# Patient Record
Sex: Male | Born: 1970 | Race: White | Hispanic: No | Marital: Single | Smoking: Former smoker
Health system: Southern US, Community
[De-identification: ages and names within clinical notes are randomized; demographics above are authoritative.]

## PROBLEM LIST (undated history)

## (undated) DIAGNOSIS — M5126 Other intervertebral disc displacement, lumbar region: Secondary | ICD-10-CM

## (undated) DIAGNOSIS — K219 Gastro-esophageal reflux disease without esophagitis: Secondary | ICD-10-CM

## (undated) DIAGNOSIS — F329 Major depressive disorder, single episode, unspecified: Secondary | ICD-10-CM

## (undated) DIAGNOSIS — E559 Vitamin D deficiency, unspecified: Secondary | ICD-10-CM

## (undated) DIAGNOSIS — J45909 Unspecified asthma, uncomplicated: Secondary | ICD-10-CM

## (undated) DIAGNOSIS — N200 Calculus of kidney: Secondary | ICD-10-CM

## (undated) DIAGNOSIS — E669 Obesity, unspecified: Secondary | ICD-10-CM

## (undated) DIAGNOSIS — J449 Chronic obstructive pulmonary disease, unspecified: Secondary | ICD-10-CM

## (undated) DIAGNOSIS — M542 Cervicalgia: Secondary | ICD-10-CM

## (undated) DIAGNOSIS — E785 Hyperlipidemia, unspecified: Secondary | ICD-10-CM

## (undated) DIAGNOSIS — F419 Anxiety disorder, unspecified: Secondary | ICD-10-CM

## (undated) DIAGNOSIS — I253 Aneurysm of heart: Secondary | ICD-10-CM

## (undated) DIAGNOSIS — R0989 Other specified symptoms and signs involving the circulatory and respiratory systems: Secondary | ICD-10-CM

## (undated) DIAGNOSIS — R569 Unspecified convulsions: Secondary | ICD-10-CM

## (undated) DIAGNOSIS — I517 Cardiomegaly: Secondary | ICD-10-CM

## (undated) DIAGNOSIS — D496 Neoplasm of unspecified behavior of brain: Secondary | ICD-10-CM

## (undated) DIAGNOSIS — A4902 Methicillin resistant Staphylococcus aureus infection, unspecified site: Secondary | ICD-10-CM

## (undated) DIAGNOSIS — F32A Depression, unspecified: Secondary | ICD-10-CM

## (undated) DIAGNOSIS — G43909 Migraine, unspecified, not intractable, without status migrainosus: Secondary | ICD-10-CM

## (undated) HISTORY — PX: CARDIAC CATHETERIZATION: SHX172

## (undated) HISTORY — PX: KNEE SURGERY: SHX244

---

## 2001-12-28 ENCOUNTER — Emergency Department (HOSPITAL_COMMUNITY): Admission: EM | Admit: 2001-12-28 | Discharge: 2001-12-28 | Payer: Self-pay | Admitting: *Deleted

## 2001-12-28 ENCOUNTER — Encounter: Payer: Self-pay | Admitting: *Deleted

## 2002-10-15 ENCOUNTER — Encounter: Payer: Self-pay | Admitting: *Deleted

## 2002-10-15 ENCOUNTER — Emergency Department (HOSPITAL_COMMUNITY): Admission: EM | Admit: 2002-10-15 | Discharge: 2002-10-15 | Payer: Self-pay | Admitting: *Deleted

## 2002-11-02 ENCOUNTER — Emergency Department (HOSPITAL_COMMUNITY): Admission: EM | Admit: 2002-11-02 | Discharge: 2002-11-02 | Payer: Self-pay | Admitting: *Deleted

## 2002-11-02 ENCOUNTER — Encounter: Payer: Self-pay | Admitting: *Deleted

## 2004-04-27 ENCOUNTER — Emergency Department (HOSPITAL_COMMUNITY): Admission: EM | Admit: 2004-04-27 | Discharge: 2004-04-28 | Payer: Self-pay | Admitting: Emergency Medicine

## 2005-04-30 ENCOUNTER — Observation Stay (HOSPITAL_COMMUNITY): Admission: EM | Admit: 2005-04-30 | Discharge: 2005-05-01 | Payer: Self-pay | Admitting: Emergency Medicine

## 2006-01-21 ENCOUNTER — Emergency Department (HOSPITAL_COMMUNITY): Admission: EM | Admit: 2006-01-21 | Discharge: 2006-01-21 | Payer: Self-pay | Admitting: Emergency Medicine

## 2006-06-10 ENCOUNTER — Emergency Department (HOSPITAL_COMMUNITY): Admission: EM | Admit: 2006-06-10 | Discharge: 2006-06-10 | Payer: Self-pay | Admitting: Emergency Medicine

## 2006-09-04 ENCOUNTER — Inpatient Hospital Stay (HOSPITAL_COMMUNITY): Admission: EM | Admit: 2006-09-04 | Discharge: 2006-09-05 | Payer: Self-pay | Admitting: Emergency Medicine

## 2006-09-04 ENCOUNTER — Ambulatory Visit: Payer: Self-pay | Admitting: Cardiovascular Disease

## 2006-09-04 ENCOUNTER — Encounter: Payer: Self-pay | Admitting: Emergency Medicine

## 2007-05-12 ENCOUNTER — Emergency Department: Payer: Self-pay | Admitting: Emergency Medicine

## 2007-05-25 ENCOUNTER — Other Ambulatory Visit: Payer: Self-pay

## 2007-05-25 ENCOUNTER — Emergency Department: Payer: Self-pay | Admitting: Internal Medicine

## 2007-05-28 ENCOUNTER — Emergency Department: Payer: Self-pay | Admitting: Emergency Medicine

## 2007-05-28 ENCOUNTER — Ambulatory Visit: Payer: Self-pay | Admitting: Urology

## 2007-06-02 ENCOUNTER — Emergency Department: Payer: Self-pay | Admitting: Emergency Medicine

## 2008-01-08 ENCOUNTER — Emergency Department (HOSPITAL_COMMUNITY): Admission: EM | Admit: 2008-01-08 | Discharge: 2008-01-08 | Payer: Self-pay | Admitting: Emergency Medicine

## 2008-02-07 ENCOUNTER — Emergency Department: Payer: Self-pay | Admitting: Emergency Medicine

## 2008-04-30 ENCOUNTER — Encounter
Admission: RE | Admit: 2008-04-30 | Discharge: 2008-05-01 | Payer: Self-pay | Admitting: Physical Medicine & Rehabilitation

## 2008-05-01 ENCOUNTER — Ambulatory Visit: Payer: Self-pay | Admitting: Physical Medicine & Rehabilitation

## 2010-03-27 ENCOUNTER — Emergency Department (HOSPITAL_COMMUNITY): Admission: EM | Admit: 2010-03-27 | Discharge: 2010-03-27 | Payer: Self-pay | Admitting: Emergency Medicine

## 2010-04-11 ENCOUNTER — Emergency Department: Payer: Self-pay | Admitting: Emergency Medicine

## 2010-07-24 ENCOUNTER — Emergency Department: Payer: Self-pay | Admitting: Emergency Medicine

## 2010-12-03 ENCOUNTER — Emergency Department (HOSPITAL_COMMUNITY)
Admission: EM | Admit: 2010-12-03 | Discharge: 2010-12-04 | Disposition: A | Discharge status: Home / Self Care | Payer: Medicaid Other | Attending: Emergency Medicine | Admitting: Emergency Medicine

## 2010-12-03 DIAGNOSIS — J45901 Unspecified asthma with (acute) exacerbation: Secondary | ICD-10-CM | POA: Insufficient documentation

## 2010-12-03 DIAGNOSIS — R0602 Shortness of breath: Secondary | ICD-10-CM | POA: Insufficient documentation

## 2010-12-04 ENCOUNTER — Emergency Department: Payer: Self-pay | Admitting: Emergency Medicine

## 2010-12-13 ENCOUNTER — Emergency Department: Payer: Self-pay | Admitting: Emergency Medicine

## 2010-12-18 ENCOUNTER — Emergency Department: Payer: Self-pay | Admitting: Emergency Medicine

## 2010-12-21 ENCOUNTER — Ambulatory Visit: Payer: Self-pay | Admitting: Urology

## 2010-12-28 ENCOUNTER — Emergency Department: Payer: Self-pay | Admitting: Unknown Physician Specialty

## 2011-01-03 ENCOUNTER — Emergency Department: Payer: Self-pay | Admitting: Emergency Medicine

## 2011-01-23 ENCOUNTER — Ambulatory Visit: Payer: Self-pay | Admitting: Urology
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
## 2011-03-03 NOTE — Discharge Summary (Signed)
Rodney Velasquez, Rodney Velasquez NO.:  [id number]   MEDICAL RECORD NO.:  [id number]          PATIENT TYPE:  INP   LOCATION:  2899                         [facility]asquez:  [facility]   PHYSICIAN:  Rodney Velasquez, MDDATE OF BIRTH:  1971/03/17   DATE OF ADMISSION:  09/04/2006  DATE OF DISCHARGE:  09/05/2006                                 DISCHARGE SUMMARY   CARDIOLOGIST:  The patient his new to our practice.  He was admitted by Dr.  Charlton Velasquez.   PRIMARY CARE PHYSICIAN:  Dr. Jacques Velasquez, [facility].   REASON FOR ADMISSION:  Chest discomfort concerning for unstable angina  pectoris.   DISCHARGE DIAGNOSES:  1. Chest discomfort, noncardiac.  2. Normal coronaries by catheterization this admission.  3. Good left ventricular function.  4. Left shoulder pain, status post reported left shoulder separation.  5. Obesity.  6. Asthma.  7. History of nephrolithiasis.  8. Status post right knee surgery.  9. Status post neck surgery as a child.  10.Elevated thyroid-stimulating hormone, needs followup with primary care      physician.   PROCEDURE PERFORMED THIS ADMISSION:  Cardiac catheterization by Dr. Arvilla Velasquez on the November 21, 20097, revealing normal coronary arteries,  normal LV function, with increased end-diastolic pressure consistent with  diastolic dysfunction.   HISTORY:  Rodney Velasquez is a 40 year old male patient who presented to [facility] on the day of admission with complaints of chest discomfort  located on his left side that awoke him from sleep.  He also had some  radiation to his left axilla, left arm, and noted hand tingling.  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  It was felt that he  should be transferred to [facility] for further evaluation and  treatment.  Dr. Eden Velasquez accepted the patient in transfer.   Velasquez COURSE:  The  patient was interviewed and examined by Dr. Eden Velasquez who  felt the patient should undergo cardiac catheterization to define his  anatomy given his risk factors for coronary disease.  The patient ruled out  for myocardial infarction by enzymes.  He underwent cardiac catheterization  by Dr. Gala Velasquez on that date of discharge.  It is noted this revealed  normal coronaries.  It was felt that the patient could be discharged home  post catheterization as long as his right femoral artery site remained  stable.   At discharge, the patient was noting some problems with his left shoulder  continuing to hurt.  He had this reported history of left shoulder  separation.  This has never happened before.  I spoke to Dr. Otelia Velasquez of  [facility], over the telephone.  He suggested getting an x-ray.  As long as this was okay, he felt that the patient should be stable enough  to be followed up as an outpatient for his shoulder problems.  So, at the  time of this dictation, the shoulder x-ray is pending.  As long as this  is  okay, he will be discharged to home.  The patient lives in [place].  He  will be asked to follow up with his primary care physician next week for  referral to an orthopedist for followup.  We will order a sling for him to  wear for comfort.  He has also been asked to start taking ibuprofen 600 mg 3  times a day for 5 days.  He will be provided with a prescription for  Percocet p.r.n. for pain.  He can get this refilled with a primary care  physician if needed.  We will see him back in our office in a couple of  weeks in [facility] to follow up on his catheterization site.   LABORATORY AND ANCILLARY DATA:  White count 9600, hemoglobin 14.6,  hematocrit 43, platelet count 187,000. INR 1.  Sodium 139, potassium 4,  glucose 106, BUN 13, creatinine 0.8, total bilirubin 0.6, alkaline  phosphatase 59, AST 19, ALT 23, total protein 5.6, albumin 3.5, calcium 8.7.  Hemoglobin A1c 5.2.  Amylase 74, lipase 52. Cardiac markers negative x3.  Total cholesterol 124, triglycerides 135, HDL 46, LDL 51.  TSH 5.555.  Urinary drug screen positive for opiates and THC.  Urinalysis normal.   Chest x-ray from admission: No acute thoracic findings.   DISCHARGE MEDICATIONS:  1. Albuterol p.r.n.  2. Ibuprofen 600 mg 3 times a day for 5 days.  3. Percocet 5/325 mg 1-2 tablets every 6 hours as needed for pain.   DIET:  Low-fat, low-sodium.   ACTIVITY:  The patient to reduce his activity to the left shoulder for the  next couple of weeks and follow up with his primary care physician as noted  above.  He will need referral to an orthopedist for followup on possible  history of left shoulder separation. Regarding his post catheterization  instructions, he is to increase activity slowly.  He may shower.  He may  walk up steps.  He may begin driving after 24 hours.  No lifting or sexual  activity for 3 days.   WOUND CARE:  He should call our office in [facility] for any groin swelling,  bleeding, bruising or fever.   FOLLOWUP:  The patient will see Rodney Velasquez, [facility] Velasquez, on September 20, 2006, at  2 p.m. in our [facility] office for post catheterization followup.  He  should see Dr. Jacques Velasquez next week for followup on his shoulder as well as  thyroid.  He should call for appointment.  As noted above, it is  recommended that he seek referral to an orthopedist for followup on his  shoulder.  Should he need further pain control, he should get refills with  his primary care physician.   Total physician and PA time: Greater than 30 minutes.      Rodney Velasquez, Rodney Velasquez      Rodney Buckles. Bensimhon, MD  Electronically Signed    SW/MEDQ  D:  09/05/2006  T:  09/05/2006  Job:  [id number]   cc:   Dr. Jacques Velasquez, [facility]

## 2011-03-03 NOTE — H&P (Signed)
Rodney Velasquez, Rodney NO.:  Velasquez   MEDICAL RECORD NO.:  [id number]          PATIENT TYPE:  INP   LOCATION:  1828                         FACILITY:  [facility]   PHYSICIAN:  Noralyn Pick. Eden Emms, MD, FACCDATE OF BIRTH:  01-Mar-1971   DATE OF ADMISSION:  09/04/2006  DATE OF DISCHARGE:                                HISTORY & PHYSICAL   PRIMARY CARE PHYSICIAN:  Dr. Stormy Fabian at [facility] in [place].   BRIEF HISTORY:  Mr. Elting is a 40 year old white male who was transferred via  [facility] from [facility] emergency room to [facility] emergency room for  cardiac evaluation.   Mr. Tornow states that he was awakened from sleep at approximately midnight  with a left-sided chest pulling sensation radiating to his left axilla.  He  also noted left arm and hand numbing and tingling. He stated that he was  dreaming that he had been shot in the chest.  He was short of breath when he  awoke and he used his albuterol inhaler which improved his shortness of  breath.  He was also diaphoretic.  His discomfort was a 7 on a scale of 0-  10.  He states that the numbing and tingling in his arm slightly improved  when he raises his hand above his shoulder.  Otherwise the discomfort does  not change with movement.  He denies prior occurrences, recent accidents or  injuries. At [facility] he received IV heparin, nitroglycerin,  morphine, Zofran and aspirin reducing his discomfort to a 3.  Note in the  emergency room his discomfort is a 5, last time it was a 0 was prior to  onset.   PAST MEDICAL HISTORY:  No known drug allergies.   MEDICATIONS:  Include albuterol inhaler p.r.n., ibuprofen p.r.n., aspirin  p.r.n.   MEDICAL HISTORY:  Is notable for obesity, asthma, kidney stones, right knee  surgery, neck surgery as a child. He denies any history of diabetes,  hypertension, myocardial infarction, CVA, COPD, bleeding, thyroid  dysfunction.  He does not know his  cholesterol.   SOCIAL HISTORY:  He resides in [place] with his mother and 19 year old son. 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  He denies any tobacco or alcohol.  He smokes pot 1-3 times  per day. The last time he used was yesterday.  He states that he does not  use any cocaine or IV drugs.  He denies exercise or diet.   FAMILY HISTORY:  His mother is alive and well at age of 6.  Father is  deceased at the age of 35 with myocardial infarction, weak heart, peptic  ulcer disease and history of CVA.  He has nine brothers and sisters.  He  does not know the specifics of their health history but he states he had a  brother that had bypass surgery in his 59s and he has a sister who has had a  stroke.   REVIEW OF SYSTEMS:  In addition to the above is  notable for chronic dyspnea  on exertion.  However, he states this is rare and it particularly occurs  during season changes.  He has urinary urgency and nocturia that he has  attributed to his kidney stones. Bilateral knee arthralgias, dysphagia on  occasion with food and liquid. Additional systems are negative.   PHYSICAL EXAMINATION:  GENERAL:  Well-nourished, well-developed, pleasant  obese white male in no apparent distress.  VITALS:  Temperature is 97, blood pressure is 128/71, pulse 72, respirations  20 to 98% sat on 2 liters.  HEENT: Is unremarkable.  NECK:  Supple without thyromegaly, adenopathy, JVD or carotid bruits.  CHEST:  Symmetrical excursion. Breath, he does have a very soft 1/6 systolic  murmur best appreciated left sternal border.  All pulses are symmetrical,  intact without abdominal or femoral bruits.  CHEST:  Symmetrical excursion.  Clear to auscultation without rales, rhonchi  or wheezing.  SKIN:  Integument is intact without rashes or lesions.  ABDOMEN:  Obese.  Bowel sounds present without organomegaly, masses or  tenderness.  EXTREMITIES:  Negative cyanosis,  clubbing or edema.  MUSCULOSKELETAL:  Unremarkable.  NEURO:  Unremarkable.   Chest x-ray done at [facility] showed no active disease.  There are 2 EKGs  from [facility]. The first being done at 00:45 a.m. I do not believe that  this is the patient is that the EKG appears grossly different from his  subsequent EKGs.  Subsequent EKG showed normal sinus rhythm, normal axis,  normal intervals with a rate of 72, no acute changes.  No old EKGs are  available for comparison.  EKG at [facility] does not show any significant  changes.  I STAT at [facility] showed H&H of 14.3 and 42.0.  Sodium 140,  potassium 3.7, BUN 13, creatinine was not performed. Glucose 151(the patient  states he has not eaten since 10:30 yesterday).  PTT is 70.  PT 13.1, INR  1.0.  Point of care markers were negative x2.   IMPRESSION:  Prolonged atypical chest discomfort of uncertain etiology,  obesity, marijuana use, hyperglycemia.   DISPOSITION:  Dr. Eden Emms reviewed the patient's history, spoke with and  examined the patient and agrees with the above.  We will admit for  observation to rule out myocardial infarction.  We will check our usual labs  as well as lipids and hemoglobin A1c, TSH, urinalysis and urine drug screen.  Given his occupation and his prolonged chest discomfort and significant  family history and his personal risk factors, we will perform a cardiac  catheterization in the morning.      Joellyn Rued, PA-C      Noralyn Pick. Eden Emms, MD, [facility]  Electronically Signed    EW/MEDQ  D:  09/04/2006  T:  09/04/2006  Job:  [id number]   cc:   Dr. Stormy Fabian

## 2011-03-03 NOTE — Cardiovascular Report (Signed)
NAMECASHTON, HOSLEY NO.:  [id number]   MEDICAL RECORD NO.:  [id number]          PATIENT TYPE:  INP   LOCATION:  2899                         FACILITY:  [facility]   PHYSICIAN:  Bevelyn Buckles. Bensimhon, MDDATE OF BIRTH:  October 29, 1970   DATE OF PROCEDURE:  09/05/2006  DATE OF DISCHARGE:                              CARDIAC CATHETERIZATION   PRIMARY CARE Heberto Sturdevant:  [facility].   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  He was admitted with a severe episode of  nocturnal chest pain.  His cardiac markers were negative.  His EKG was  nondiagnostic.  He was brought to the cardiac catheterization lab for  diagnostic angiography.   PROCEDURES PERFORMED:  1. Selective coronary angiography.  2. Left heart cath.  3. Left ventriculogram.  4. AngioSeal femoral closure device.   DESCRIPTION OF PROCEDURE:  The risks and benefits were explained.  Consent  was signed and placed on the chart.  A 6-French arterial sheath was placed  in the right femoral artery using a modified Seldinger technique.  Standard  catheters including a JL-4, a JR-4 and angled pigtail were used for the  procedure.  We also used a 4-French JR-4 to better image the nondominant  right coronary.  There were no apparent complications at the end of the  procedure.  The patient's right groin arteriotomy site was closed with the  Angio-Seal closure device.  There was good hemostasis.   Central aortic pressure was 147/85 with a mean of 113.  LV pressure was  157/12 with an EDP of 23.  There is no aortic stenosis.   Left main was normal.   LAD was a long vessel coursing to the apex.  It gave off 2 small to moderate  diagonals.  There was no angiographic CAD.  The distal LAD was small.   Left circumflex was a large dominant vessel.  It gave off 2 OMs, a small  posterolateral and a small PDA.  There was no angiographic CAD.   The  right coronary was a small nondominant vessel without any significant  coronary disease.   LEFT VENTRICULOGRAM:  EF of 65%.  No wall motion abnormalities.   ASSESSMENT:  1. Normal coronary arteries.  2. Non normal left ventricular function with increased EDP consistent with      diastolic dysfunction.  3. Likely noncardiac chest pain.   PLAN/DISCUSSION:  We will discharge him home today with the plans for  aggressive risk factor modification.  I have made it very clear to him that  despite his Angio-Seal, he is not to drive for at least 24 hours due to the  significant risk of bleeding.      Bevelyn Buckles. Bensimhon, MD  Electronically Signed     DRB/MEDQ  D:  09/05/2006  T:  09/05/2006  Job:  [id number]

## 2011-03-03 NOTE — H&P (Signed)
Rodney Velasquez, MENKEN NO.:  [id number]   MEDICAL RECORD NO.:  [id number]          PATIENT TYPE:  OBV   LOCATION:  A227                          FACILITY:  [facility]   PHYSICIAN:  Jackie Plum, M.D.DATE OF BIRTH:  05-Jun-1971   DATE OF ADMISSION:  04/30/2005  DATE OF DISCHARGE:  LH                                HISTORY & PHYSICAL   CHIEF COMPLAINT:  Chest pain x2 days.   HISTORY OF PRESENT ILLNESS:  The patient presents with 2 days of right-  sided, sharp pain which radiates into this right arm without any known  etiology or contributing factor.  Pain was associated with shortness of  breath and diaphoresis without nausea or vomiting.  Onset of pain was at  rest and it was about 7-8/10 initially by the time of ED evaluation.  He was  given nitroglycerin with some relief of his pain.  There is no history of  cough with sputum production, fever or chills and no abdominal pain.  He  denies dysuria with no history of previous ankle swelling, calf or leg pain.  The patient had a CT scan of the chest done which ruled out pulmonary  embolism.  The patient's lab work with complete metabolic panel was within  normal limits.  His CBC was essentially within normal limits except for  marginally elevated RBC of 5.91.  An ABG done prior to CT scan indicated pH  of 7.42, pCO2 41, pO277, bicarb 26 with O2 saturations 96% on room air.  The  CT scan also noted a small, tiny, 2-3 mm on right lung which was not  consistent with neoplasm, but possibly due to previous infection, per  report.  The patient's pain came down some, but will admit the patient for  observation for cardiac workup.   PAST MEDICAL HISTORY:  1.  No previous history diabetes, hypertension or previous heart disease.  2.  History of asthma.  3.  History of heart murmur.   MEDICATIONS:  Albuterol inhalers.   ALLERGIES:  No known drug allergies.   FAMILY HISTORY:  His father died at age 62.  Prior to his death,  father  suffered a stroke and also a heart attack.  Age at the time of this was  unclear.   SOCIAL HISTORY:  The patient smokes pot occasionally and does not drink  alcohol or smoke cigarettes.   PHYSICAL EXAMINATION:  VITAL SIGNS:  BP 120/76, pulse 69, respirations 16,  temperature 96.9 degrees Fahrenheit.  GENERAL:  The patient is an obese gentleman in acute pulmonary distress.  HEENT:  No pallor, no icterus.  Oropharynx moist.  NECK:  Supple.  No carotid bruits.  LUNGS:  Clear to auscultation.  CARDIAC:  Regular rate and rhythm.  No gallops or murmurs.  ABDOMEN:  Obese, soft, bowel sounds present.  EXTREMITIES:  No cyanosis or edema.  NEUROLOGIC:  Nonfocal.   LABORATORY DATA AND X-RAY FINDINGS:  Please see above for complete metabolic  panel, CBC, saturations on room air and CT scan of the chest as noted above.   EKG was  reported to have shown sinus rhythm at 88 beats per minute without  any acute ST changes.   IMPRESSION:  Chest pain in an obese, 40 year old gentleman without any  previous heart disease or any diabetes, hypertension or dyslipidemia.   PLAN:  The patient will be admitted to [facility] to monitor enzymes.  Stress test  could be considered in the morning.       GO/MEDQ  D:  05/01/2005  T:  05/01/2005  Job:  [id number]

## 2011-07-10 LAB — BASIC METABOLIC PANEL
BUN: 13
CO2: 27
Chloride: 102
GFR calc Af Amer: >60
Glucose, Bld: 154 — ABNORMAL HIGH
Potassium: 3.8
Sodium: 134 — ABNORMAL LOW

## 2011-07-10 LAB — CBC
HCT: 45.8
Hemoglobin: 15.9
MCHC: 34.8
MCV: 80
WBC: 7.4

## 2011-07-10 LAB — DIFFERENTIAL
Lymphs Abs: 1.4
Neutrophils Relative %: 71

## 2011-09-05 ENCOUNTER — Emergency Department: Payer: Self-pay | Admitting: Emergency Medicine

## 2011-10-04 ENCOUNTER — Emergency Department: Payer: Self-pay | Admitting: Emergency Medicine

## 2011-12-19 ENCOUNTER — Emergency Department: Payer: Self-pay | Admitting: Emergency Medicine

## 2012-02-26 ENCOUNTER — Emergency Department: Payer: Self-pay | Admitting: Emergency Medicine

## 2012-10-14 ENCOUNTER — Emergency Department: Payer: Self-pay | Admitting: Emergency Medicine

## 2012-11-02 ENCOUNTER — Emergency Department: Payer: Self-pay | Admitting: Emergency Medicine

## 2012-11-28 ENCOUNTER — Emergency Department: Payer: Self-pay | Admitting: Emergency Medicine

## 2012-12-03 ENCOUNTER — Emergency Department: Payer: Self-pay | Admitting: Emergency Medicine

## 2012-12-08 ENCOUNTER — Emergency Department: Payer: Self-pay | Admitting: Emergency Medicine

## 2012-12-13 ENCOUNTER — Emergency Department: Payer: Self-pay | Admitting: Emergency Medicine

## 2012-12-14 DIAGNOSIS — N200 Calculus of kidney: Secondary | ICD-10-CM

## 2012-12-14 HISTORY — DX: Calculus of kidney: N20.0

## 2012-12-24 ENCOUNTER — Encounter (HOSPITAL_COMMUNITY): Payer: Self-pay | Admitting: *Deleted

## 2012-12-24 ENCOUNTER — Emergency Department (HOSPITAL_COMMUNITY): Payer: Medicaid Other

## 2012-12-24 ENCOUNTER — Emergency Department (HOSPITAL_COMMUNITY)
Admission: EM | Admit: 2012-12-24 | Discharge: 2012-12-25 | Disposition: A | Discharge status: Home / Self Care | Payer: Medicaid Other | Attending: Emergency Medicine | Admitting: Emergency Medicine

## 2012-12-24 DIAGNOSIS — J449 Chronic obstructive pulmonary disease, unspecified: Secondary | ICD-10-CM | POA: Insufficient documentation

## 2012-12-24 DIAGNOSIS — Z79899 Other long term (current) drug therapy: Secondary | ICD-10-CM | POA: Insufficient documentation

## 2012-12-24 DIAGNOSIS — R110 Nausea: Secondary | ICD-10-CM | POA: Insufficient documentation

## 2012-12-24 DIAGNOSIS — R319 Hematuria, unspecified: Secondary | ICD-10-CM | POA: Insufficient documentation

## 2012-12-24 DIAGNOSIS — R350 Frequency of micturition: Secondary | ICD-10-CM | POA: Insufficient documentation

## 2012-12-24 DIAGNOSIS — J4489 Other specified chronic obstructive pulmonary disease: Secondary | ICD-10-CM | POA: Insufficient documentation

## 2012-12-24 DIAGNOSIS — Z9889 Other specified postprocedural states: Secondary | ICD-10-CM | POA: Insufficient documentation

## 2012-12-24 DIAGNOSIS — N201 Calculus of ureter: Secondary | ICD-10-CM | POA: Insufficient documentation

## 2012-12-24 DIAGNOSIS — F17200 Nicotine dependence, unspecified, uncomplicated: Secondary | ICD-10-CM | POA: Insufficient documentation

## 2012-12-24 DIAGNOSIS — N390 Urinary tract infection, site not specified: Secondary | ICD-10-CM | POA: Insufficient documentation

## 2012-12-24 DIAGNOSIS — Z8739 Personal history of other diseases of the musculoskeletal system and connective tissue: Secondary | ICD-10-CM | POA: Insufficient documentation

## 2012-12-24 DIAGNOSIS — R300 Dysuria: Secondary | ICD-10-CM | POA: Insufficient documentation

## 2012-12-24 HISTORY — DX: Other intervertebral disc displacement, lumbar region: M51.26

## 2012-12-24 HISTORY — DX: Chronic obstructive pulmonary disease, unspecified: J44.9

## 2012-12-24 HISTORY — DX: Calculus of kidney: N20.0

## 2012-12-24 LAB — URINALYSIS, ROUTINE W REFLEX MICROSCOPIC
Glucose, UA: NEGATIVE mg/dL
Ketones, ur: NEGATIVE mg/dL
Nitrite: NEGATIVE
Specific Gravity, Urine: 1.025 (ref 1.005–1.030)
pH: 6.5 (ref 5.0–8.0)

## 2012-12-24 LAB — BASIC METABOLIC PANEL
CO2: 25 mEq/L (ref 19–32)
Calcium: 9.8 mg/dL (ref 8.4–10.5)
Creatinine, Ser: 0.92 mg/dL (ref 0.50–1.35)
GFR calc Af Amer: >90 mL/min (ref >90)
GFR calc non Af Amer: >90 mL/min (ref >90)
Glucose, Bld: 87 mg/dL (ref 70–99)
Potassium: 3.9 mEq/L (ref 3.5–5.1)
Sodium: 138 mEq/L (ref 135–145)

## 2012-12-24 LAB — URINE MICROSCOPIC-ADD ON

## 2012-12-24 MED ORDER — HYDROMORPHONE HCL PF 1 MG/ML IJ SOLN
1.0000 mg | Freq: Once | INTRAMUSCULAR | Status: AC
  Administered 2012-12-24: 1 mg via INTRAVENOUS
  Filled 2012-12-24: qty 1

## 2012-12-24 MED ORDER — TAMSULOSIN HCL 0.4 MG PO CAPS: 0.4000 mg | ORAL_CAPSULE | Freq: Every day | ORAL | Status: DC

## 2012-12-24 MED ORDER — TAMSULOSIN HCL 0.4 MG PO CAPS
0.4000 mg | ORAL_CAPSULE | Freq: Once | ORAL | Status: AC
  Administered 2012-12-25: 0.4 mg via ORAL
  Filled 2012-12-24: qty 1

## 2012-12-24 MED ORDER — ONDANSETRON HCL 4 MG/2ML IJ SOLN
4.0000 mg | Freq: Once | INTRAMUSCULAR | Status: AC
  Administered 2012-12-24: 4 mg via INTRAVENOUS
  Filled 2012-12-24: qty 2

## 2012-12-24 MED ORDER — CEPHALEXIN 500 MG PO CAPS: 500.0000 mg | ORAL_CAPSULE | Freq: Four times a day (QID) | ORAL | Status: DC

## 2012-12-24 MED ORDER — DEXTROSE 5 % IV SOLN
1.0000 g | Freq: Once | INTRAVENOUS | Status: AC
  Administered 2012-12-24: 1 g via INTRAVENOUS
  Filled 2012-12-24: qty 10

## 2012-12-24 MED ORDER — OXYCODONE-ACETAMINOPHEN 5-325 MG PO TABS: 1.0000 | ORAL_TABLET | ORAL | Status: DC | PRN

## 2012-12-24 MED ORDER — KETOROLAC TROMETHAMINE 30 MG/ML IJ SOLN
30.0000 mg | Freq: Once | INTRAMUSCULAR | Status: AC
  Administered 2012-12-24: 30 mg via INTRAVENOUS
  Filled 2012-12-24: qty 1

## 2012-12-24 NOTE — ED Notes (Addendum)
Right flank pain that radiates to right groin, started yesterday but pain has increased, + nausea, denies vomiting, + blood in urine per pt., hx of kidney stones

## 2012-12-25 MED ORDER — TAMSULOSIN HCL 0.4 MG PO CAPS
ORAL_CAPSULE | ORAL | Status: AC
  Filled 2012-12-25: qty 1

## 2012-12-26 LAB — URINE CULTURE: Culture: NO GROWTH

## 2012-12-28 NOTE — ED Provider Notes (Signed)
History     CSN: [id number]  Arrival date & time 12/24/12  2045   First MD Initiated Contact with Patient 12/24/12 2124      Chief Complaint  Patient presents with  . Flank Pain    (Consider location/radiation/quality/duration/timing/severity/associated sxs/prior treatment) HPI Comments: Rodney Velasquez is a 42 y.o. Male presenting with right flank pain now radiating into his right groin since yesterday.  Pain is sharp and intermittent.  He reports nausea without emesis and has had hematuria and increased urinary frequency of small amounts of urine.  He has a history of kidney stones and has never had a need for surgical intervention.  He does not have a urologist.  He denies fevers or chills, abdominal pain, shortness of breath.  He has taken tylenol without improved pain.  Nothing makes his symptoms worse.       The history is provided by the patient.    Past Medical History  Diagnosis Date  . Kidney stones   . COPD (chronic obstructive pulmonary disease)   . Ruptured lumbar disc     Past Surgical History  Procedure Laterality Date  . Knee surgery    . Cardiac catheterization      History reviewed. No pertinent family history.  History  Substance Use Topics  . Smoking status: Light Tobacco Smoker    Types: Cigarettes  . Smokeless tobacco: Not on file  . Alcohol Use: No      Review of Systems  Constitutional: Negative for fever.  HENT: Negative for congestion, sore throat and neck pain.   Eyes: Negative.   Respiratory: Negative for chest tightness and shortness of breath.   Cardiovascular: Negative for chest pain.  Gastrointestinal: Positive for nausea. Negative for vomiting, abdominal pain and blood in stool.  Genitourinary: Positive for dysuria, hematuria and flank pain.  Musculoskeletal: Negative for joint swelling and arthralgias.  Skin: Negative.  Negative for rash and wound.  Neurological: Negative for dizziness, weakness, light-headedness, numbness and  headaches.  Psychiatric/Behavioral: Negative.     Allergies  Review of patient's allergies indicates no known allergies.  Home Medications   Current Outpatient Rx  Name  Route  Sig  Dispense  Refill  . acetaminophen (TYLENOL) 500 MG tablet   Oral   Take 1,500-2,000 mg by mouth 4 (four) times daily as needed for pain.         Marland Kitchen albuterol (PROVENTIL HFA;VENTOLIN HFA) 108 (90 BASE) MCG/ACT inhaler   Inhalation   Inhale 2 puffs into the lungs every 6 (six) hours as needed for wheezing or shortness of breath.         . clonazePAM (KLONOPIN) 0.5 MG tablet   Oral   Take 0.5 mg by mouth 3 (three) times daily as needed for anxiety.         . cephALEXin (KEFLEX) 500 MG capsule   Oral   Take 1 capsule (500 mg total) by mouth 4 (four) times daily.   40 capsule   0   . oxyCODONE-acetaminophen (PERCOCET/ROXICET) 5-325 MG per tablet   Oral   Take 1 tablet by mouth every 4 (four) hours as needed for pain.   20 tablet   0   . tamsulosin (FLOMAX) 0.4 MG CAPS   Oral   Take 1 capsule (0.4 mg total) by mouth daily after supper.   10 capsule   0     BP 156/114  Pulse 96  Temp(Src) 98.2 F (36.8 C) (Oral)  Resp 20  Ht 5'  6.5" (1.689 m)  Wt 240 lb (108.863 kg)  BMI 38.16 kg/m2  SpO2 100%  Physical Exam  Nursing note and vitals reviewed. Constitutional: He appears well-developed and well-nourished.  HENT:  Head: Normocephalic and atraumatic.  Eyes: Conjunctivae are normal.  Neck: Normal range of motion.  Cardiovascular: Normal rate, regular rhythm, normal heart sounds and intact distal pulses.   Pulmonary/Chest: Effort normal and breath sounds normal. He has no wheezes.  Abdominal: Soft. Bowel sounds are normal. There is no tenderness. There is CVA tenderness.  Musculoskeletal: Normal range of motion.  Neurological: He is alert.  Skin: Skin is warm and dry.  Psychiatric: He has a normal mood and affect.    ED Course  Procedures (including critical care time)  Labs  Reviewed  URINALYSIS, ROUTINE W REFLEX MICROSCOPIC - Abnormal; Notable for the following:    APPearance HAZY (*)    Hgb urine dipstick LARGE (*)    Protein, ur TRACE (*)    Urobilinogen, UA 4.0 (*)    Leukocytes, UA SMALL (*)    All other components within normal limits  URINE MICROSCOPIC-ADD ON - Abnormal; Notable for the following:    Squamous Epithelial / LPF FEW (*)    Bacteria, UA MANY (*)    Casts WBC CAST (*)    All other components within normal limits  URINE CULTURE  BASIC METABOLIC PANEL   No results found.   1. Right ureteral stone   2. UTI (lower urinary tract infection)    Pt given IV fluids,  zofran and dilaudid IV with improved pain.  Rocephin 1 gram IV.  toradol 30 IV prior to dc home.  First dose of flomax also given.   MDM  Spoke with on call PA with [facility],  Ms. Ostler (sp?).  Plan for f/u with their office this week. Pt to call for appt..  Urine cx sent. Patient was given rocephin 1 gram IV prior to dc home.  Prescribed keflex,  Oxycodone,  Also recommended flomax by Urology.  First dose given prior to dc home.  Patients labs and/or radiological studies were viewed and considered during the medical decision making and disposition process.         Burgess Amor, PA-C 12/28/12 [phone]

## 2012-12-29 NOTE — ED Provider Notes (Signed)
Medical screening examination/treatment/procedure(s) were performed by non-physician practitioner and as supervising physician I was immediately available for consultation/collaboration.   Dione Booze, MD 12/29/12 [phone]

## 2013-01-01 ENCOUNTER — Encounter (HOSPITAL_COMMUNITY): Payer: Self-pay

## 2013-01-01 ENCOUNTER — Emergency Department (HOSPITAL_COMMUNITY)
Admission: EM | Admit: 2013-01-01 | Discharge: 2013-01-01 | Disposition: A | Discharge status: Home / Self Care | Payer: Medicaid Other | Attending: Emergency Medicine | Admitting: Emergency Medicine

## 2013-01-01 DIAGNOSIS — J4489 Other specified chronic obstructive pulmonary disease: Secondary | ICD-10-CM | POA: Insufficient documentation

## 2013-01-01 DIAGNOSIS — Z8739 Personal history of other diseases of the musculoskeletal system and connective tissue: Secondary | ICD-10-CM | POA: Insufficient documentation

## 2013-01-01 DIAGNOSIS — N200 Calculus of kidney: Secondary | ICD-10-CM | POA: Insufficient documentation

## 2013-01-01 DIAGNOSIS — F17200 Nicotine dependence, unspecified, uncomplicated: Secondary | ICD-10-CM | POA: Insufficient documentation

## 2013-01-01 DIAGNOSIS — Z79899 Other long term (current) drug therapy: Secondary | ICD-10-CM | POA: Insufficient documentation

## 2013-01-01 DIAGNOSIS — R112 Nausea with vomiting, unspecified: Secondary | ICD-10-CM | POA: Insufficient documentation

## 2013-01-01 DIAGNOSIS — J449 Chronic obstructive pulmonary disease, unspecified: Secondary | ICD-10-CM | POA: Insufficient documentation

## 2013-01-01 HISTORY — DX: Calculus of kidney: N20.0

## 2013-01-01 LAB — URINALYSIS, ROUTINE W REFLEX MICROSCOPIC
Ketones, ur: NEGATIVE mg/dL
Nitrite: NEGATIVE
Specific Gravity, Urine: <1.005 — ABNORMAL LOW (ref 1.005–1.030)
Urobilinogen, UA: 0.2 mg/dL (ref 0.0–1.0)

## 2013-01-01 MED ORDER — OXYCODONE-ACETAMINOPHEN 5-325 MG PO TABS: 1.0000 | ORAL_TABLET | ORAL | Status: DC | PRN

## 2013-01-01 MED ORDER — ONDANSETRON HCL 4 MG/2ML IJ SOLN
4.0000 mg | Freq: Once | INTRAMUSCULAR | Status: AC
  Administered 2013-01-01: 4 mg via INTRAVENOUS
  Filled 2013-01-01: qty 2

## 2013-01-01 MED ORDER — CEPHALEXIN 500 MG PO CAPS: 500.0000 mg | ORAL_CAPSULE | Freq: Four times a day (QID) | ORAL | Status: DC

## 2013-01-01 MED ORDER — HYDROMORPHONE HCL PF 2 MG/ML IJ SOLN
2.0000 mg | Freq: Once | INTRAMUSCULAR | Status: AC
  Administered 2013-01-01: 2 mg via INTRAVENOUS
  Filled 2013-01-01: qty 1

## 2013-01-01 NOTE — ED Notes (Signed)
Known renal stones, c/o pain right flank with nausea/vomiting
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
## 2013-01-20 ENCOUNTER — Ambulatory Visit: Payer: Self-pay | Admitting: Family Medicine

## 2013-02-19 ENCOUNTER — Emergency Department: Payer: Self-pay | Admitting: Emergency Medicine

## 2013-02-19 LAB — DRUG SCREEN, URINE
Barbiturates, Ur Screen: NEGATIVE (ref ?–200)
Benzodiazepine, Ur Scrn: NEGATIVE (ref ?–200)
Cannabinoid 50 Ng, Ur ~~LOC~~: NEGATIVE (ref ?–50)
MDMA (Ecstasy)Ur Screen: NEGATIVE (ref ?–500)
Methadone, Ur Screen: NEGATIVE (ref ?–300)
Opiate, Ur Screen: NEGATIVE (ref ?–300)
Tricyclic, Ur Screen: NEGATIVE (ref ?–1000)

## 2013-02-19 LAB — COMPREHENSIVE METABOLIC PANEL
Albumin: 4.1 g/dL (ref 3.4–5.0)
Anion Gap: 9 (ref 7–16)
Calcium, Total: 9.5 mg/dL (ref 8.5–10.1)
Co2: 26 mmol/L (ref 21–32)
Creatinine: 0.77 mg/dL (ref 0.60–1.30)
EGFR (African American): >60
EGFR (Non-African Amer.): >60
Potassium: 3.7 mmol/L (ref 3.5–5.1)
SGPT (ALT): 25 U/L (ref 12–78)
Sodium: 140 mmol/L (ref 136–145)
Total Protein: 7.2 g/dL (ref 6.4–8.2)

## 2013-02-19 LAB — URINALYSIS, COMPLETE
Bilirubin,UR: NEGATIVE
Ph: 5 (ref 4.5–8.0)
RBC,UR: 38 /HPF (ref 0–5)
Specific Gravity: 1.029 (ref 1.003–1.030)
Squamous Epithelial: 10
WBC UR: 287 /HPF (ref 0–5)

## 2013-02-19 LAB — CBC
HCT: 47.4 % (ref 40.0–52.0)
HGB: 16 g/dL (ref 13.0–18.0)
Platelet: 184 10*3/uL (ref 150–440)
RDW: 13.7 % (ref 11.5–14.5)

## 2013-02-19 LAB — ETHANOL: Ethanol: <3 mg/dL

## 2013-03-09 ENCOUNTER — Encounter (HOSPITAL_COMMUNITY): Payer: Self-pay | Admitting: *Deleted

## 2013-03-09 ENCOUNTER — Emergency Department (HOSPITAL_COMMUNITY)
Admission: EM | Admit: 2013-03-09 | Discharge: 2013-03-09 | Disposition: A | Discharge status: Home / Self Care | Payer: Medicaid Other | Attending: Emergency Medicine | Admitting: Emergency Medicine

## 2013-03-09 DIAGNOSIS — G43909 Migraine, unspecified, not intractable, without status migrainosus: Secondary | ICD-10-CM | POA: Insufficient documentation

## 2013-03-09 DIAGNOSIS — G8929 Other chronic pain: Secondary | ICD-10-CM | POA: Insufficient documentation

## 2013-03-09 DIAGNOSIS — J4489 Other specified chronic obstructive pulmonary disease: Secondary | ICD-10-CM | POA: Insufficient documentation

## 2013-03-09 DIAGNOSIS — M545 Low back pain, unspecified: Secondary | ICD-10-CM | POA: Insufficient documentation

## 2013-03-09 DIAGNOSIS — Z87442 Personal history of urinary calculi: Secondary | ICD-10-CM | POA: Insufficient documentation

## 2013-03-09 DIAGNOSIS — Z8739 Personal history of other diseases of the musculoskeletal system and connective tissue: Secondary | ICD-10-CM | POA: Insufficient documentation

## 2013-03-09 DIAGNOSIS — Z86011 Personal history of benign neoplasm of the brain: Secondary | ICD-10-CM | POA: Insufficient documentation

## 2013-03-09 DIAGNOSIS — G40909 Epilepsy, unspecified, not intractable, without status epilepticus: Secondary | ICD-10-CM | POA: Insufficient documentation

## 2013-03-09 DIAGNOSIS — Z8679 Personal history of other diseases of the circulatory system: Secondary | ICD-10-CM | POA: Insufficient documentation

## 2013-03-09 DIAGNOSIS — F17200 Nicotine dependence, unspecified, uncomplicated: Secondary | ICD-10-CM | POA: Insufficient documentation

## 2013-03-09 DIAGNOSIS — J449 Chronic obstructive pulmonary disease, unspecified: Secondary | ICD-10-CM | POA: Insufficient documentation

## 2013-03-09 DIAGNOSIS — Z79899 Other long term (current) drug therapy: Secondary | ICD-10-CM | POA: Insufficient documentation

## 2013-03-09 DIAGNOSIS — N390 Urinary tract infection, site not specified: Secondary | ICD-10-CM | POA: Insufficient documentation

## 2013-03-09 HISTORY — DX: Unspecified asthma, uncomplicated: J45.909

## 2013-03-09 HISTORY — DX: Migraine, unspecified, not intractable, without status migrainosus: G43.909

## 2013-03-09 HISTORY — DX: Unspecified convulsions: R56.9

## 2013-03-09 HISTORY — DX: Cardiomegaly: I51.7

## 2013-03-09 HISTORY — DX: Neoplasm of unspecified behavior of brain: D49.6

## 2013-03-09 HISTORY — DX: Other specified symptoms and signs involving the circulatory and respiratory systems: R09.89

## 2013-03-09 LAB — URINALYSIS, ROUTINE W REFLEX MICROSCOPIC
Ketones, ur: NEGATIVE mg/dL
Nitrite: NEGATIVE
Protein, ur: NEGATIVE mg/dL
Urobilinogen, UA: 1 mg/dL (ref 0.0–1.0)
pH: 6 (ref 5.0–8.0)

## 2013-03-09 MED ORDER — OXYCODONE-ACETAMINOPHEN 5-325 MG PO TABS
1.0000 | ORAL_TABLET | Freq: Once | ORAL | Status: AC
  Administered 2013-03-09: 1 via ORAL
  Filled 2013-03-09: qty 1

## 2013-03-09 MED ORDER — DIVALPROEX SODIUM 250 MG PO DR TAB
500.0000 mg | DELAYED_RELEASE_TABLET | Freq: Once | ORAL | Status: AC
  Administered 2013-03-09: 500 mg via ORAL
  Filled 2013-03-09: qty 2

## 2013-03-09 MED ORDER — LIDOCAINE HCL (PF) 1 % IJ SOLN
INTRAMUSCULAR | Status: AC
  Filled 2013-03-09: qty 5

## 2013-03-09 MED ORDER — CEFTRIAXONE SODIUM 1 G IJ SOLR
1.0000 g | Freq: Once | INTRAMUSCULAR | Status: AC
  Administered 2013-03-09: 1 g via INTRAMUSCULAR
  Filled 2013-03-09: qty 10

## 2013-03-09 MED ORDER — CYCLOBENZAPRINE HCL 10 MG PO TABS
10.0000 mg | ORAL_TABLET | Freq: Once | ORAL | Status: AC
  Administered 2013-03-09: 10 mg via ORAL
  Filled 2013-03-09: qty 1

## 2013-03-09 MED ORDER — CIPROFLOXACIN HCL 250 MG PO TABS
500.0000 mg | ORAL_TABLET | Freq: Two times a day (BID) | ORAL | Status: DC
  Administered 2013-03-09: 500 mg via ORAL
  Filled 2013-03-09: qty 2

## 2013-03-09 NOTE — ED Notes (Signed)
Pt incarcerated Friday and has not had seizure med Depakote or pain meds Percocet, Tylenol #3 or Lyrica for chronic neck and back pain, states depends on severity of pain depends if he takes Percocet of Tylenol #3 per pt.

## 2013-03-09 NOTE — ED Provider Notes (Signed)
History     CSN: [id number]  Arrival date & time 03/09/13  1033   First MD Initiated Contact with Patient 03/09/13 1218      Chief Complaint  Patient presents with  . Back Pain    (Consider location/radiation/quality/duration/timing/severity/associated sxs/prior treatment) HPI Rodney Velasquez is a 42 y.o. male who presents to the ED with back pain. He is accompanied by the [facility].  He has a history of chronic pain and has been incarcerated for 2 days and not had his medication. The nurse at the jail will not be back until Tuesday to administer medications. Also has hx of seizures and has not had his Depakote. He has also been on Cipro for a UTI that he has not had in 2 days. The history was provided by the patient.  Past Medical History  Diagnosis Date  . Kidney stones   . COPD (chronic obstructive pulmonary disease)   . Ruptured lumbar disc   . Renal stone 12/14/12  . Seizures   . Migraine   . Asthma   . Enlarged heart   . Poor circulation   . Brain tumor     Past Surgical History  Procedure Laterality Date  . Knee surgery    . Cardiac catheterization      No family history on file.  History  Substance Use Topics  . Smoking status: Heavy Tobacco Smoker -- 0.50 packs/day    Types: Cigarettes  . Smokeless tobacco: Not on file  . Alcohol Use: No      Review of Systems  Constitutional: Negative for fever and chills.  HENT: Negative for neck pain.   Respiratory: Negative for chest tightness.   Gastrointestinal: Negative for nausea and vomiting.  Musculoskeletal: Positive for back pain.  Skin: Negative for wound.  Neurological: Negative for headaches.  Psychiatric/Behavioral: The patient is not nervous/anxious.     Allergies  Review of patient's allergies indicates no known allergies.  Home Medications   Current Outpatient Rx  Name  Route  Sig  Dispense  Refill  . acetaminophen (TYLENOL) 500 MG tablet   Oral   Take 500-1,000 mg by mouth daily as needed for  pain.          Marland Kitchen albuterol (PROVENTIL HFA;VENTOLIN HFA) 108 (90 BASE) MCG/ACT inhaler   Inhalation   Inhale 2 puffs into the lungs every 6 (six) hours as needed for wheezing or shortness of breath.         . cephALEXin (KEFLEX) 500 MG capsule   Oral   Take 1 capsule (500 mg total) by mouth 4 (four) times daily.   40 capsule   0   . cephALEXin (KEFLEX) 500 MG capsule   Oral   Take 1 capsule (500 mg total) by mouth 4 (four) times daily.   28 capsule   0   . clonazePAM (KLONOPIN) 0.5 MG tablet   Oral   Take 0.5 mg by mouth 3 (three) times daily as needed for anxiety.         Marland Kitchen oxyCODONE-acetaminophen (PERCOCET/ROXICET) 5-325 MG per tablet   Oral   Take 1 tablet by mouth every 4 (four) hours as needed for pain.   20 tablet   0   . oxyCODONE-acetaminophen (PERCOCET/ROXICET) 5-325 MG per tablet   Oral   Take 1 tablet by mouth every 4 (four) hours as needed for pain.   20 tablet   0   . tamsulosin (FLOMAX) 0.4 MG CAPS   Oral   Take  1 capsule (0.4 mg total) by mouth daily after supper.   10 capsule   0     BP 128/88  Pulse 78  Temp(Src) 98.4 F (36.9 C) (Oral)  Resp 18  Ht 5\' 7"  (1.702 m)  Wt 235 lb (106.595 kg)  BMI 36.8 kg/m2  SpO2 98%  Physical Exam  Nursing note and vitals reviewed. Constitutional: He is oriented to person, place, and time. He appears well-developed and well-nourished. No distress.  HENT:  Head: Normocephalic and atraumatic.  Eyes: EOM are normal.  Neck: Neck supple.  Cardiovascular: Normal rate and regular rhythm.   Pulmonary/Chest: Effort normal and breath sounds normal.  Abdominal: Soft. There is no tenderness.  Musculoskeletal:       Lumbar back: He exhibits decreased range of motion and tenderness. He exhibits no spasm.       Back:  Pain across lower lumbar area with palpation. Pedal pulses strong and equal bilateral. Adequate circulation, good touch sensation.  Neurological: He is alert and oriented to person, place, and  time. He has normal strength and normal reflexes. No cranial nerve deficit or sensory deficit.  Skin: Skin is warm and dry.  Psychiatric: He has a normal mood and affect. His behavior is normal. Judgment and thought content normal.    ED Course  Procedures (including critical care time) Results for orders placed during the hospital encounter of 03/09/13 (from the past 24 hour(s))  URINALYSIS, ROUTINE W REFLEX MICROSCOPIC     Status: Abnormal   Collection Time    03/09/13 12:28 PM      Result Value Range   Color, Urine YELLOW  YELLOW   APPearance CLEAR  CLEAR   Specific Gravity, Urine 1.025  1.005 - 1.030   pH 6.0  5.0 - 8.0   Glucose, UA NEGATIVE  NEGATIVE mg/dL   Hgb urine dipstick MODERATE (*) NEGATIVE   Bilirubin Urine NEGATIVE  NEGATIVE   Ketones, ur NEGATIVE  NEGATIVE mg/dL   Protein, ur NEGATIVE  NEGATIVE mg/dL   Urobilinogen, UA 1.0  0.0 - 1.0 mg/dL   Nitrite NEGATIVE  NEGATIVE   Leukocytes, UA MODERATE (*) NEGATIVE  URINE MICROSCOPIC-ADD ON     Status: Abnormal   Collection Time    03/09/13 12:28 PM      Result Value Range   Squamous Epithelial / LPF FEW (*) RARE   WBC, UA 21-50  <3 WBC/hpf   RBC / HPF 7-10  <3 RBC/hpf   Bacteria, UA FEW (*) RARE    MDM  42 y.o. male with chronic low back pain and UTI. He is [facility] from the jail for medication for his UTI, chronic back pain and his seizure medication because there will be no nurse at the jail until Tuesday. Will give him his dose of Depakote, Cipro and Percocet for today.  Will also give Rocephin 1 Gram IM since he can not get his medication until the RN is back. He is not allowed to take any medication with him back to the jail. They will return with the patient if needed before the RN returns.       [place], [place] 03/09/13 [phone]

## 2013-03-09 NOTE — ED Notes (Signed)
Pt has been incarcerated x 2 days.  Hx of chronic neck/back pain.  States has not had meds since.  Nurse at jail will not be in until Tuesday to administer meds.  C/o chronic pain.  Pt also has hx of seizures and has not had depakote since Friday also.  Denies seizure activity at this time.

## 2013-03-10 NOTE — ED Provider Notes (Signed)
Medical screening examination/treatment/procedure(s) were performed by non-physician practitioner and as supervising physician I was immediately available for consultation/collaboration.   Dione Booze, MD 03/10/13 [phone]

## 2013-03-11 LAB — URINE CULTURE
Colony Count: NO GROWTH
Culture: NO GROWTH

## 2013-03-22 ENCOUNTER — Emergency Department: Payer: Self-pay | Admitting: Unknown Physician Specialty

## 2013-03-22 LAB — URINALYSIS, COMPLETE
Bacteria: NONE SEEN
Bilirubin,UR: NEGATIVE
Glucose,UR: NEGATIVE mg/dL (ref 0–75)
Ketone: NEGATIVE
Ph: 7 (ref 4.5–8.0)

## 2013-03-24 LAB — URINE CULTURE

## 2013-05-17 ENCOUNTER — Emergency Department: Payer: Self-pay | Admitting: Emergency Medicine

## 2013-05-17 LAB — URINALYSIS, COMPLETE
Bilirubin,UR: NEGATIVE
Glucose,UR: NEGATIVE mg/dL (ref 0–75)
Ketone: NEGATIVE
Ph: 6 (ref 4.5–8.0)
Protein: NEGATIVE
RBC,UR: 3 /HPF (ref 0–5)
Squamous Epithelial: 1

## 2013-05-17 LAB — COMPREHENSIVE METABOLIC PANEL
Calcium, Total: 9.1 mg/dL (ref 8.5–10.1)
Co2: 31 mmol/L (ref 21–32)
EGFR (Non-African Amer.): >60
Glucose: 85 mg/dL (ref 65–99)
Osmolality: 275 (ref 275–301)
SGOT(AST): 20 U/L (ref 15–37)
SGPT (ALT): 24 U/L (ref 12–78)
Sodium: 138 mmol/L (ref 136–145)
Total Protein: 6.6 g/dL (ref 6.4–8.2)

## 2013-05-17 LAB — ETHANOL
Ethanol %: <0.003 % (ref 0.000–0.080)
Ethanol: <3 mg/dL

## 2013-05-17 LAB — CBC
HCT: 44.4 % (ref 40.0–52.0)
HGB: 15.4 g/dL (ref 13.0–18.0)
MCH: 28 pg (ref 26.0–34.0)
MCV: 81 fL (ref 80–100)
RDW: 13.9 % (ref 11.5–14.5)

## 2013-05-17 LAB — DRUG SCREEN, URINE
Barbiturates, Ur Screen: NEGATIVE (ref ?–200)
Cocaine Metabolite,Ur ~~LOC~~: NEGATIVE (ref ?–300)
MDMA (Ecstasy)Ur Screen: NEGATIVE (ref ?–500)

## 2013-05-17 LAB — TROPONIN I: Troponin-I: <0.02 ng/mL

## 2013-06-21 ENCOUNTER — Emergency Department: Payer: Self-pay | Admitting: Emergency Medicine

## 2014-01-03 ENCOUNTER — Emergency Department: Payer: Self-pay | Admitting: Emergency Medicine

## 2014-01-03 ENCOUNTER — Ambulatory Visit: Payer: Self-pay | Admitting: Urology

## 2014-01-03 LAB — CBC WITH DIFFERENTIAL/PLATELET
BASOS PCT: 0.4 %
Basophil #: 0.1 10*3/uL (ref 0.0–0.1)
Eosinophil #: 0.2 10*3/uL (ref 0.0–0.7)
Eosinophil %: 1.3 %
HCT: 41 % (ref 40.0–52.0)
HGB: 13.8 g/dL (ref 13.0–18.0)
LYMPHS PCT: 9.2 %
Lymphocyte #: 1.5 10*3/uL (ref 1.0–3.6)
MCH: 27.4 pg (ref 26.0–34.0)
MCHC: 33.6 g/dL (ref 32.0–36.0)
MCV: 82 fL (ref 80–100)
Monocyte #: 1.2 x10 3/mm — ABNORMAL HIGH (ref 0.2–1.0)
Monocyte %: 7.6 %
Neutrophil #: 13 10*3/uL — ABNORMAL HIGH (ref 1.4–6.5)
Neutrophil %: 81.5 %
PLATELETS: 189 10*3/uL (ref 150–440)
RBC: 5.03 10*6/uL (ref 4.40–5.90)
RDW: 13 % (ref 11.5–14.5)
WBC: 15.9 10*3/uL — AB (ref 3.8–10.6)

## 2014-01-03 LAB — BASIC METABOLIC PANEL
ANION GAP: 3 — AB (ref 7–16)
BUN: 10 mg/dL (ref 7–18)
CHLORIDE: 100 mmol/L (ref 98–107)
CO2: 30 mmol/L (ref 21–32)
CREATININE: 0.83 mg/dL (ref 0.60–1.30)
Calcium, Total: 8.7 mg/dL (ref 8.5–10.1)
EGFR (African American): >60
Glucose: 100 mg/dL — ABNORMAL HIGH (ref 65–99)
Osmolality: 266 (ref 275–301)
POTASSIUM: 3.5 mmol/L (ref 3.5–5.1)
SODIUM: 133 mmol/L — AB (ref 136–145)

## 2014-01-04 ENCOUNTER — Emergency Department: Payer: Self-pay | Admitting: Emergency Medicine

## 2014-01-06 ENCOUNTER — Emergency Department: Payer: Self-pay | Admitting: Emergency Medicine

## 2014-01-06 LAB — BASIC METABOLIC PANEL
ANION GAP: 2 — AB (ref 7–16)
BUN: 7 mg/dL (ref 7–18)
CALCIUM: 9.1 mg/dL (ref 8.5–10.1)
CO2: 32 mmol/L (ref 21–32)
CREATININE: 1 mg/dL (ref 0.60–1.30)
Chloride: 103 mmol/L (ref 98–107)
EGFR (African American): >60
EGFR (Non-African Amer.): >60
Glucose: 68 mg/dL (ref 65–99)
Osmolality: 270 (ref 275–301)
POTASSIUM: 4.2 mmol/L (ref 3.5–5.1)
Sodium: 137 mmol/L (ref 136–145)

## 2014-01-06 LAB — CBC
HCT: 45.6 % (ref 40.0–52.0)
HGB: 15.2 g/dL (ref 13.0–18.0)
MCH: 27.5 pg (ref 26.0–34.0)
MCHC: 33.4 g/dL (ref 32.0–36.0)
MCV: 82 fL (ref 80–100)
Platelet: 303 10*3/uL (ref 150–440)
RBC: 5.55 10*6/uL (ref 4.40–5.90)
RDW: 13.3 % (ref 11.5–14.5)
WBC: 10.7 10*3/uL — ABNORMAL HIGH (ref 3.8–10.6)

## 2014-01-08 LAB — CULTURE, BLOOD (SINGLE)

## 2014-01-09 LAB — WOUND CULTURE

## 2014-03-13 ENCOUNTER — Emergency Department: Payer: Self-pay | Admitting: Emergency Medicine

## 2014-03-13 LAB — BASIC METABOLIC PANEL
Anion Gap: 9 (ref 7–16)
BUN: 14 mg/dL (ref 7–18)
CALCIUM: 8.8 mg/dL (ref 8.5–10.1)
Chloride: 105 mmol/L (ref 98–107)
Co2: 25 mmol/L (ref 21–32)
Creatinine: 0.79 mg/dL (ref 0.60–1.30)
GLUCOSE: 94 mg/dL (ref 65–99)
OSMOLALITY: 278 (ref 275–301)
Potassium: 3.9 mmol/L (ref 3.5–5.1)
SODIUM: 139 mmol/L (ref 136–145)

## 2014-03-13 LAB — CBC WITH DIFFERENTIAL/PLATELET
BASOS PCT: 0.7 %
Basophil #: 0.1 10*3/uL (ref 0.0–0.1)
Eosinophil #: 0.6 10*3/uL (ref 0.0–0.7)
Eosinophil %: 6 %
HCT: 43.1 % (ref 40.0–52.0)
HGB: 14.1 g/dL (ref 13.0–18.0)
LYMPHS PCT: 21.3 %
Lymphocyte #: 2 10*3/uL (ref 1.0–3.6)
MCH: 26.4 pg (ref 26.0–34.0)
MCHC: 32.6 g/dL (ref 32.0–36.0)
MCV: 81 fL (ref 80–100)
MONOS PCT: 6.8 %
Monocyte #: 0.6 x10 3/mm (ref 0.2–1.0)
NEUTROS ABS: 6 10*3/uL (ref 1.4–6.5)
NEUTROS PCT: 65.2 %
PLATELETS: 178 10*3/uL (ref 150–440)
RBC: 5.32 10*6/uL (ref 4.40–5.90)
RDW: 14.2 % (ref 11.5–14.5)
WBC: 9.2 10*3/uL (ref 3.8–10.6)

## 2015-02-06 NOTE — Consult Note (Signed)
PATIENT NAME:  Rodney Velasquez, Rodney Velasquez MR#:  [id number] DATE OF BIRTH:  Nov 17, 1970  DATE OF CONSULTATION:  01/03/2014  REFERRING PHYSICIAN:   CONSULTING PHYSICIAN:  Shaneen Reeser P. Benjie Karvonen, MD   PRIMARY CARE PHYSICIAN: Meindert A. Brunetta Genera, MD  CHIEF COMPLAINT: Scrotal abscess.   HISTORY OF PRESENT ILLNESS: A 44 year old male with a history of brain tumor, seizure, restless leg syndrome who presents with the above complaint. Over the past 2 days, the patient noticed that he has had a scrotal pain. He had some ciprofloxacin at home, so he decided to take his Cipro yesterday. He denies any fevers. In the ER, he was seen by urologist, who did not feel the patient has a Fournier gangrene. The abscess was packed by a urologist and he got 1 dose of vancomycin. The patient did not want to stay in the hospital overnight for antibiotics. He says he has 4 pitbulls at home and he needs to take care of them. They have not been fed and he has not found anyone to take care of them. I discussed this with Dr. Thomasene Lot.    REVIEW OF SYSTEMS: CONSTITUTIONAL: Positive low-grade fevers. No fatigue, weakness, weight loss or gain.  EYES: No blurred vision, double vision, glaucoma, cataracts. HEENT: No ear pain.  RESPIRATORY: no  cough, wheezing, hemoptysis.  CARDIOVASCULAR: No chest pain. No orthopnea, palpitations, edema.  GASTROINTESTINAL: No nausea, vomiting, diarrhea, abdominal pain, melena, or ulcers. GENITOURINARY: No dysuria or hematuria.  ENDOCRINE: Nopolyuria/polydipsia .  HEMATOLOGIC AND LYMPHATIC: No anemia.  SKIN: positive for scrotal abscess.  MUSCULOSKELETAL: Positive back pain and neck pain.  NEUROLOGICAL: No history of CVA or TIA.  PSYCHIATRIC: He does have anxiety.   PAST MEDICAL HISTORY:  1. Seizure.  2. Brain tumor. 3. Restless leg syndrome. 4. Heart murmur.  5. Neuropathy.  6. Ruptured disk.  7. COPD.   8. Depression.  9. Anxiety.  10. Asthma.  11. Kidney stones.   PAST SURGICAL HISTORY:  1. Neck  surgery. 2. Right knee surgery.   ALLERGIES: No known drug allergies.   SOCIAL HISTORY: The patient smokes a pack of cigarettes a day. No alcohol or IV drug use.   MEDICATIONS: 1. Norco 5/325, 1 tablet every 4 to 6 hours as needed.  2. Klonopin 0.5 milligrams 3 times daily. 3. Ibuprofen as needed.  4. Flexeril 5 milligrams 3 times daily. 5. Depakote.  6. Albuterol.   FAMILY HISTORY: Positive for breast cancer, hypertension, diabetes.   PHYSICAL EXAMINATION: VITAL SIGNS: Temperature 98.1, pulse 99, respirations 20, blood pressure 128/70, 97% on room air.  GENERAL: The patient is alert, but not in acute distress.  HEENT: Head is atraumatic. Pupils are round and reactive. Sclerae anicteric. Mucous membranes are moist. Oropharynx: Clear.  NECK: Supple without JVD, carotid bruit or enlarged thyroidCARDIOVASCULAR: Regular rate and rhythm. There is a 2/6 systolic ejection murmur heard best at the right sternal border without radiation. PMI is not displaced.  LUNGS: Clear to auscultation without crackles, rhonchi or wheezing. Normal percussion.  ABDOMEN: Bowel sounds are present. Nontender, nondistended.  EXTREMITIES: No clubbing, cyanosis or edema.  GENITOURINARY: Scrotum shows a large abscess that is packed without any current discharge of this time.  NEUROLOGIC: Cranial nerves 2 through 12 are intact. There are no focal deficits.  SKIN: Without rashes. Positive scrotal abscesses dictated above.  BACK: No CVA or vertebral tenderness.   LABORATORY DATA: White blood cells 15.8 hemoglobin 13.8, hematocrit 41, platelets are 189,000. Sodium 133, potassium 3.5, chloride 100, bicarbonate 30, BUN  10, creatinine 0.83, glucose 100.   Ultrasound of the testicles shows an extensive inflammatory process in the ball of the left hemiscrotum with marked scrotal wall thickening and hypervascularity compatible with infection continuing areas of hypoechogenicity, which could represent developing abscess  formation.   ASSESSMENT AND PLAN: A 44 year old male who presented with scrotal abscess status post I and D.  1. Scrotal abscess. The patient does not want to be admitted into the hospital. He says he has 4 pitbulls that need to be taken care of. I spoke with Dr. Thomasene Lot, the ER physician, as well as the urologist. The urologist saw the patient while in the ER, he did not feel the patient has Fournier gangrene. He packed the abscess and recommended that the patient have the packing redressed tomorrow. He did receive vancomycin. Since the patient does not want to stay in the hospital, the patient may have some oral antibiotics and return back to the ER in the a.m.  2. Leukocytosis secondary the problem #1. The patient needs to have this followed tomorrow.  3. Hyponatremia, likely secondary to his acute infection. Will need to be followed tomorrow.  4. Tobacco dependence. The patient was counseled for 4 minutes regarding stopping smoking. He does not want to be discharged with a nicotine patch. He is not interested in quitting smoking at this time.   It was advised that the patient stay in the hospital for further treatment and management; however, due to his situation, the patient will want to leave and further recommendations and discharge planning per Dr. Thomasene Lot.   Thank you for allowing [place] to participate in the care of the patient.  TIME SPENT ON THIS CONSULT: Fifty minutes.   ____________________________ Donell Beers. Benjie Karvonen, MD spm:[id number] D: 01/03/2014 20:11:13 ET T: 01/03/2014 20:51:31 ET JOB#: [id number]  cc: Rhyan Wolters P. Benjie Karvonen, MD, <Dictator> Donell Beers Madix Blowe MD ELECTRONICALLY SIGNED 01/04/2014 12:21

## 2015-02-06 NOTE — Op Note (Signed)
PATIENT NAME:  Rodney Velasquez, Rodney Velasquez MR#:  [id number] DATE OF BIRTH:  1971-09-11  DATE OF PROCEDURE:  01/03/2014  PREPROCEDURE DIAGNOSIS: Left scrotal abscess.   POSTPROCEDURE DIAGNOSIS: Left scrotal abscess.  PROCEDURE:  Bedside incision and drainage of scrotal abscess.   SURGEON: Leona Singleton, MD  ASSISTANT: None.   ANESTHESIA: 25 mL of 1% lidocaine with epinephrine.   COMPLICATIONS: None.   FINDINGS:  Purulent fluid expressed.   INDICATION FOR PROCEDURE:  A 44 year old male being admitted through the Emergency Room at [facility] with a partially drained left scrotal abscess and surrounding erythema, provides verbal informed consent for incision and drainage. All risks, benefits and alternatives were fully explained.  PROCEDURE NOTE:  The patient was identified. He was awake and conscious, and discussing things with me throughout the entire procedure. The left scrotum was inflamed. A purulent and fluctuant area along the lower border of the left hemiscrotum was noted. I then infiltrated this area with 10 mL of 1% lidocaine with epinephrine local anesthesia. Once that area was adequately anesthetized, a 3 cm incision was made with a #11 blade in a longitudinal fashion in the midline of the fluctuant area, and purulent fluid was expressed. Then using digital exploration to open up any loculations. The defect did extend superolaterally approximately 2 cm. I then irrigated this copiously with saline, and then packed it with 1/4 inch packing strip. Final dressing was 4 x 4s, ABD pad, and mesh underwear. The patient tolerated the procedure throughout the entirety, with minimal discomfort.   CONDITION:  Stable for admission.   DISPOSITION: Daily packing changes. IV antibiotics per primary team.   ____________________________ Juliann Mule. Nobie Putnam, MD erh:mr D: 01/03/2014 20:00:45 ET T: 01/03/2014 22:36:05 ET JOB#: [id number]  cc: Juliann Mule. Nobie Putnam, MD, <Dictator> Carroll Sage  MD ELECTRONICALLY SIGNED 01/06/2014 15:41

## 2015-02-06 NOTE — Consult Note (Signed)
Admit Diagnosis:   SWOLLEN TESTICAL: Onset Date: 03-Jan-2014, Status: Active, Description: SWOLLEN TESTICAL    Seizure:    Brain tumor:    Restless Leg Syndrome:    Heart Murmer:    Neuropathy:    Enlarged Heart:    Ruptured Disc:    COPD:    Depression:    Anxiety:    MRSA greater than 6 months ago:    ASTHMA:    KIDNEY STONE:    Neck Surgery:    RT KNEE SURGERY:   Home Medications: Medication Instructions Status  Norco 5 mg-325 mg oral tablet 1 tab(s) orally q 4-6 hours prn pain Active  amoxicillin 875 mg oral tablet 1 tab(s) orally 2 times a day  Active  Flexeril 5 mg oral tablet 1 tab(s) orally 3 times a day x 5 days as needed   Active  Klonopin 0.5 mg oral tablet 1 tab(s) orally 3 times a day Active  albuterol inhaler PRN  Active  Depakote  orally  Active  IBU  orally  Active   Lab Results: Routine Chem:  21-Mar-15 18:15   Glucose, Serum  100  BUN 10  Creatinine (comp) 0.83  Sodium, Serum  133  Potassium, Serum 3.5  Chloride, Serum 100  CO2, Serum 30  Calcium (Total), Serum 8.7  Anion Gap  3  Osmolality (calc) 266  eGFR (African American) >60  eGFR (Non-African American) >60 (eGFR values <12m/min/1.73 m2 may be an indication of chronic kidney disease (CKD). Calculated eGFR is useful in patients with stable renal function. The eGFR calculation will not be reliable in acutely ill patients when serum creatinine is changing rapidly. It is not useful in  patients on dialysis. The eGFR calculation may not be applicable to patients at the low and high extremes of body sizes, pregnant women, and vegetarians.)  Routine Hem:  21-Mar-15 18:15   WBC (CBC)  15.9  RBC (CBC) 5.03  Hemoglobin (CBC) 13.8  Hematocrit (CBC) 41.0  Platelet Count (CBC) 189  MCV 82  MCH 27.4  MCHC 33.6  RDW 13.0  Neutrophil % 81.5  Lymphocyte % 9.2  Monocyte % 7.6  Eosinophil % 1.3  Basophil % 0.4  Neutrophil #  13.0  Lymphocyte # 1.5  Monocyte #  1.2   Eosinophil # 0.2  Basophil # 0.1 (Result(s) reported on 03 Jan 2014 at 06:30PM.)   Radiology Results:  Radiology Results: [place]    21-Mar-15 18:06, [place]  REASON FOR EXAM:    tender swollen left testical with drainage  COMMENTS:   LMP: (Male)    PROCEDURE: [place] - [place]ICULAR W/DOPPLER  - Jan 03 2014  6:06PM     CLINICAL DATA:  Left testicular tenderness, swelling and drainage    EXAM:  SCROTAL ULTRASOUND    DOPPLER ULTRASOUND OF THE TESTICLES    TECHNIQUE:  Complete ultrasound examination of the testicles, epididymis, and  other scrotal structures was performed. Color and spectral Doppler  ultrasound were also utilized to evaluate blood flow to the  testicles.    COMPARISON:  None    FINDINGS:  Right testicle    Measurements: 4.6 x 1.7 x 2.6 cm. Heterogeneous intra testicular  echogenicity. Mild scattered edema questioned. No mass or  calcifications. Hypervascularity within right testis on color  Doppler imaging.    Left testicle  Measurements: 4.6 x 2.2 x 2.7 cm. Heterogeneous echogenicity. Mildly  hypervascular. No mass or calcification.    Right epididymis:  Not adequately  visualized    Left epididymis:  Not adequately visualized    Hydrocele:  No significant hydroceles seen.    Varicocele:  Absent bilaterally    Additional findings: Heterogeneous marked wall thickening of the  left hemiscrotum. Complex hypoechoic regions are seen inferior to  the left testis, up to 3.3 cm in size, could be related to hematoma  or abscess. The hypervascularity associated with this region on  color Doppler imaging makes this most likely inflammatory in origin  and suspicious for abscess. No definite calcifications or echogenic  foci are seen to suggest gas formation/infection by gas-forming  organism at this time.    Pulsed Doppler interrogation of both testes demonstrates low  resistance arterial and venous waveforms bilaterally.      IMPRESSION:  Extensive inflammatory process in ball of theleft hemiscrotum with  marked scrotal wall thickening and hypervascularity compatible with  infection, containing areas of hypo echogenicity which could  represent developing abscess formation.    No discrete intratesticular abnormalities identified.  No definite echogenic foci or dirty shadowing are seen to suggest  gas formation/Fourniers gangrene at this time though close clinical  follow-up is recommended.      Electronically Signed    By: Lavonia Dana M.D.    On: 01/03/2014 18:14         Verified By: Burnetta Sabin, M.D.,    No Known Allergies:    Present Illness 44 yo with 6 days of worsening left scotal pain and swelling.  No fevers or chills.  No history of scotal abscess or I&D.  No trauma.   Began as "a bump."   No concern of STD.  U/S c/w with superficial abscess.   Case History and Physical Exam:  Chief Complaint swollen testes   Past Surgical History None  No srotal surgery   Family History Non-Contributory   HEENT PERLA   Neck/Nodes Supple   Chest/Lungs Clear   Breasts Not examined   Cardiovascular RRR   Abdomen Benign   Genitalia erythematous and indurated left hemiscrotum.  No crepitus.  R testes normal.  Left testes exam limited due to pain.  No perineal involvment.  No inguinal involvement   Rectal Not examined   Musculoskeletal Full range of motion   Neurological Grossly WNL   Skin Warm    Impression 44 y.o. with left scotral abcess, erythema and induration.  Beside I&D performed under local.  10cc purulent fluid expressed.  Wound packed   Plan - Abx per primary team - Dressing change tomorrow - Sitz baths daily starting 3/23   Electronic Signatures: Felicity Coyer (MD)  (Signed 21-Mar-15 20:11)  Authored: Health Issues, Significant Events - History, Home Medications, Labs, Radiology Results, Allergies, General Aspect/Present Illness, History and Physical Exam,  Impression/Plan   Last Updated: 21-Mar-15 20:11 by Felicity Coyer (MD)

## 2015-07-17 ENCOUNTER — Emergency Department
Admission: EM | Admit: 2015-07-17 | Discharge: 2015-07-17 | Disposition: A | Discharge status: Home / Self Care | Payer: Medicare Other | Attending: Emergency Medicine | Admitting: Emergency Medicine

## 2015-07-17 ENCOUNTER — Encounter: Payer: Self-pay | Admitting: Emergency Medicine

## 2015-07-17 DIAGNOSIS — S41032A Puncture wound without foreign body of left shoulder, initial encounter: Secondary | ICD-10-CM | POA: Diagnosis not present

## 2015-07-17 DIAGNOSIS — W540XXA Bitten by dog, initial encounter: Secondary | ICD-10-CM | POA: Diagnosis not present

## 2015-07-17 DIAGNOSIS — Z720 Tobacco use: Secondary | ICD-10-CM | POA: Diagnosis not present

## 2015-07-17 DIAGNOSIS — Y9389 Activity, other specified: Secondary | ICD-10-CM | POA: Insufficient documentation

## 2015-07-17 DIAGNOSIS — Y998 Other external cause status: Secondary | ICD-10-CM | POA: Diagnosis not present

## 2015-07-17 DIAGNOSIS — Z791 Long term (current) use of non-steroidal anti-inflammatories (NSAID): Secondary | ICD-10-CM | POA: Diagnosis not present

## 2015-07-17 DIAGNOSIS — Z79899 Other long term (current) drug therapy: Secondary | ICD-10-CM | POA: Diagnosis not present

## 2015-07-17 DIAGNOSIS — S41132A Puncture wound without foreign body of left upper arm, initial encounter: Secondary | ICD-10-CM | POA: Diagnosis not present

## 2015-07-17 DIAGNOSIS — S61532A Puncture wound without foreign body of left wrist, initial encounter: Secondary | ICD-10-CM | POA: Insufficient documentation

## 2015-07-17 DIAGNOSIS — Y9289 Other specified places as the place of occurrence of the external cause: Secondary | ICD-10-CM | POA: Diagnosis not present

## 2015-07-17 DIAGNOSIS — S41002A Unspecified open wound of left shoulder, initial encounter: Secondary | ICD-10-CM | POA: Diagnosis present

## 2015-07-17 MED ORDER — MELOXICAM 15 MG PO TABS: 15.0000 mg | ORAL_TABLET | Freq: Every day | ORAL | Status: AC

## 2015-07-17 MED ORDER — OXYCODONE-ACETAMINOPHEN 5-325 MG PO TABS
1.0000 | ORAL_TABLET | Freq: Once | ORAL | Status: AC
Start: 2015-07-17 — End: 2015-07-17
  Administered 2015-07-17: 1 via ORAL
  Filled 2015-07-17: qty 1

## 2015-07-17 MED ORDER — AMOXICILLIN-POT CLAVULANATE 875-125 MG PO TABS: 1.0000 | ORAL_TABLET | Freq: Two times a day (BID) | ORAL | Status: AC

## 2015-07-17 NOTE — ED Notes (Signed)
[employer] notified of animal bite. Pt refused to speak to police regarding who/where the dog bite happened.

## 2015-07-17 NOTE — ED Notes (Signed)
NAD noted at time of D/C. Pt ambulatory to the lobby at this time. Pt refused wheelchair to the lobby.

## 2015-07-17 NOTE — Discharge Instructions (Signed)

## 2015-07-17 NOTE — ED Notes (Signed)
Refuses to give information on dog, explained risk of rabies and continues to refuse to give info

## 2015-07-17 NOTE — ED Provider Notes (Signed)
[facility] Emergency Department Provider Note  ____________________________________________  Time seen: Approximately 3:39 PM  I have reviewed the triage vital signs and the nursing notes.   HISTORY  Chief Complaint Animal Bite    HPI Rodney Velasquez is a 44 y.o. male resents to the emergency department status post being bitten by dog. He states that he was bitten on his left wrist. He states there was only 1 bite by a approximately 70-80 pound dog. He states that he was out passing fliers for his own missing dog, when he approached a neighbor to give him a flyer. He states that he was warned by his neighbor that the dog would bite if he got too close. He leaned down to let the dog get a smell of his hand, but when he made a motion to give a flyer to the neighbor, the dog bit him. The patient adamantly refuses any rabies vaccinations at this time. Patient states that the pain is moderate to severe. He states that he was able to control bleeding prior to arrival. Other injuries or complaints.   Past Medical History  Diagnosis Date  . Kidney stones   . COPD (chronic obstructive pulmonary disease) ([place])   . Ruptured lumbar disc   . Renal stone 12/14/12  . Seizures ([place])   . Migraine   . Asthma   . Enlarged heart   . Poor circulation   . Brain tumor ([place])     There are no active problems to display for this patient.   Past Surgical History  Procedure Laterality Date  . Knee surgery    . Cardiac catheterization      Current Outpatient Rx  Name  Route  Sig  Dispense  Refill  . amoxicillin-clavulanate (AUGMENTIN) 875-125 MG tablet   Oral   Take 1 tablet by mouth 2 (two) times daily.   14 tablet   0   . beclomethasone (QVAR) 80 MCG/ACT inhaler   Inhalation   Inhale 1 puff into the lungs as needed (Shortness of breath).         Marland Kitchen buPROPion (WELLBUTRIN XL) 150 MG 24 hr tablet   Oral   Take 150 mg by mouth daily.         . clonazePAM (KLONOPIN)  0.5 MG tablet   Oral   Take 0.5 mg by mouth 3 (three) times daily.          Marland Kitchen desvenlafaxine (PRISTIQ) 50 MG 24 hr tablet   Oral   Take 50 mg by mouth daily.         Marland Kitchen etodolac (LODINE) 400 MG tablet   Oral   Take 400 mg by mouth 2 (two) times daily.         Marland Kitchen gabapentin (NEURONTIN) 400 MG capsule   Oral   Take 400 mg by mouth 3 (three) times daily.         . meloxicam (MOBIC) 15 MG tablet   Oral   Take 1 tablet (15 mg total) by mouth daily.   30 tablet   0   . tiotropium (SPIRIVA) 18 MCG inhalation capsule   Inhalation   Place 18 mcg into inhaler and inhale daily.         . traMADol (ULTRAM) 50 MG tablet   Oral   Take 50 mg by mouth every 6 (six) hours as needed for pain.           Allergies Review of patient's allergies indicates no  known allergies.  No family history on file.  Social History Social History  Substance Use Topics  . Smoking status: Heavy Tobacco Smoker -- 0.50 packs/day    Types: Cigarettes  . Smokeless tobacco: None  . Alcohol Use: No    Review of Systems Constitutional: No fever/chills Eyes: No visual changes. ENT: No sore throat. Cardiovascular: Denies chest pain. Respiratory: Denies shortness of breath. Gastrointestinal: No abdominal pain.  No nausea, no vomiting.  No diarrhea.  No constipation. Genitourinary: Negative for dysuria. Musculoskeletal: Negative for back pain. Skin: Negative for rash. Positive for dog bite to left wrist. He reports 3 puncture wounds. Neurological: Negative for headaches, focal weakness or numbness.  10-point ROS otherwise negative.  ____________________________________________   PHYSICAL EXAM:  VITAL SIGNS: ED Triage Vitals  Enc Vitals Group     BP 07/17/15 1341 156/90 mmHg     Pulse Rate 07/17/15 1341 88     Resp 07/17/15 1341 18     Temp 07/17/15 1341 99 F (37.2 C)     Temp Source 07/17/15 1341 Oral     SpO2 07/17/15 1341 96 %     Weight 07/17/15 1341 265 lb (120.203 kg)      Height 07/17/15 1341 5\' 6"  (1.676 m)     Head Cir --      Peak Flow --      Pain Score 07/17/15 1342 9     Pain Loc --      Pain Edu? --      Excl. in [place]? --     Constitutional: Alert and oriented. Well appearing and in no acute distress. Eyes: Conjunctivae are normal. PERRL. EOMI. Head: Atraumatic. Nose: No congestion/rhinnorhea. Mouth/Throat: Mucous membranes are moist.  Oropharynx non-erythematous. Neck: No stridor.   Cardiovascular: Normal rate, regular rhythm. Grossly normal heart sounds.  Good peripheral circulation. Respiratory: Normal respiratory effort.  No retractions. Lungs CTAB. Gastrointestinal: Soft and nontender. No distention. No abdominal bruits. No CVA tenderness. Musculoskeletal: No lower extremity tenderness nor edema.  No joint effusions. Neurologic:  Normal speech and language. No gross focal neurologic deficits are appreciated. No gait instability. Skin:  Skin is warm, dry. No rashes noted. Patient has 3 puncture wounds to the left wrist. 2 are on the dorsal aspect just proximal to the wrist. One puncture wound is on the anterior surface just proximal to the wrist. Bleeding is controlled. His foreign objects. No exposed muscle or  tendons. Range of motion is limited by pain. Radial pulses 2 appreciated. Cap refill is less than 2 seconds in all digits distal to injury. Psychiatric: Mood and affect are normal. Speech and behavior are normal.  ____________________________________________   LABS (all labs ordered are listed, but only abnormal results are displayed)  Labs Reviewed - No data to display ____________________________________________  EKG   ____________________________________________  RADIOLOGY   ____________________________________________   PROCEDURES  Procedure(s) performed: None  Critical Care performed: No  ____________________________________________   INITIAL IMPRESSION / ASSESSMENT AND PLAN / ED COURSE  Pertinent labs &  imaging results that were available during my care of the patient were reviewed by me and considered in my medical decision making (see chart for details).  Patient is a 44 year old male who presents to the emergency department complaining of dog bite to his left wrist. He adamantly refuses sensation. Advised patient that we will place him on antibiotics. Due to the nature of the bite with it being primarily puncture wounds will not close at this time. Thoroughly irrigated out the  puncture wounds with 300 mL of normal saline. Wrapped the area. Gave patient instructions to complete entire course of antibiotics. I advised patient to keep wounds covered and use triple antibiotic ointment. Patient to return to emergency department for any setting of symptoms. Patient realized understanding and compliance with treatment plan. ____________________________________________   FINAL CLINICAL IMPRESSION(S) / ED DIAGNOSES  Final diagnoses:  Multiple puncture wounds of left shoulder and upper arm, initial encounter  Dog bite      Darletta Moll, PA-C 07/17/15 [place], MD 07/18/15 [phone]

## 2015-08-04 ENCOUNTER — Encounter: Payer: Self-pay | Admitting: Emergency Medicine

## 2015-08-04 ENCOUNTER — Emergency Department
Admission: EM | Admit: 2015-08-04 | Discharge: 2015-08-05 | Disposition: A | Discharge status: Home / Self Care | Payer: Medicare Other | Attending: Emergency Medicine | Admitting: Emergency Medicine

## 2015-08-04 DIAGNOSIS — T368X4A Poisoning by other systemic antibiotics, undetermined, initial encounter: Secondary | ICD-10-CM | POA: Diagnosis not present

## 2015-08-04 DIAGNOSIS — Z79899 Other long term (current) drug therapy: Secondary | ICD-10-CM | POA: Insufficient documentation

## 2015-08-04 DIAGNOSIS — Z720 Tobacco use: Secondary | ICD-10-CM | POA: Insufficient documentation

## 2015-08-04 DIAGNOSIS — Y998 Other external cause status: Secondary | ICD-10-CM | POA: Diagnosis not present

## 2015-08-04 DIAGNOSIS — T452X4A Poisoning by vitamins, undetermined, initial encounter: Secondary | ICD-10-CM | POA: Insufficient documentation

## 2015-08-04 DIAGNOSIS — Y9389 Activity, other specified: Secondary | ICD-10-CM | POA: Diagnosis not present

## 2015-08-04 DIAGNOSIS — T50904A Poisoning by unspecified drugs, medicaments and biological substances, undetermined, initial encounter: Secondary | ICD-10-CM

## 2015-08-04 DIAGNOSIS — R451 Restlessness and agitation: Secondary | ICD-10-CM | POA: Insufficient documentation

## 2015-08-04 DIAGNOSIS — Z7951 Long term (current) use of inhaled steroids: Secondary | ICD-10-CM | POA: Diagnosis not present

## 2015-08-04 DIAGNOSIS — Y9289 Other specified places as the place of occurrence of the external cause: Secondary | ICD-10-CM | POA: Insufficient documentation

## 2015-08-04 DIAGNOSIS — Z792 Long term (current) use of antibiotics: Secondary | ICD-10-CM | POA: Diagnosis not present

## 2015-08-04 DIAGNOSIS — T402X4A Poisoning by other opioids, undetermined, initial encounter: Secondary | ICD-10-CM | POA: Diagnosis not present

## 2015-08-04 LAB — BASIC METABOLIC PANEL
Anion gap: 6 (ref 5–15)
BUN: 15 mg/dL (ref 6–20)
CO2: 29 mmol/L (ref 22–32)
CREATININE: 0.95 mg/dL (ref 0.61–1.24)
Calcium: 9.3 mg/dL (ref 8.9–10.3)
Chloride: 103 mmol/L (ref 101–111)
Glucose, Bld: 100 mg/dL — ABNORMAL HIGH (ref 65–99)
POTASSIUM: 4.5 mmol/L (ref 3.5–5.1)
SODIUM: 138 mmol/L (ref 135–145)

## 2015-08-04 LAB — TROPONIN I

## 2015-08-04 LAB — CBC WITH DIFFERENTIAL/PLATELET
BASOS ABS: 0.1 10*3/uL (ref 0–0.1)
Basophils Relative: 1 %
EOS ABS: 0.5 10*3/uL (ref 0–0.7)
EOS PCT: 5 %
HCT: 48.9 % (ref 40.0–52.0)
HEMOGLOBIN: 16.2 g/dL (ref 13.0–18.0)
LYMPHS PCT: 22 %
Lymphs Abs: 1.9 10*3/uL (ref 1.0–3.6)
MCH: 27 pg (ref 26.0–34.0)
MCHC: 33.2 g/dL (ref 32.0–36.0)
MCV: 81.5 fL (ref 80.0–100.0)
Monocytes Absolute: 0.6 10*3/uL (ref 0.2–1.0)
Monocytes Relative: 8 %
NEUTROS PCT: 64 %
Neutro Abs: 5.5 10*3/uL (ref 1.4–6.5)
PLATELETS: 146 10*3/uL — AB (ref 150–440)
RBC: 6 MIL/uL — AB (ref 4.40–5.90)
RDW: 13.6 % (ref 11.5–14.5)
WBC: 8.5 10*3/uL (ref 3.8–10.6)

## 2015-08-04 LAB — GLUCOSE, CAPILLARY: Glucose-Capillary: 84 mg/dL (ref 65–99)

## 2015-08-04 MED ORDER — HALOPERIDOL LACTATE 5 MG/ML IJ SOLN
INTRAMUSCULAR | Status: AC
  Filled 2015-08-04: qty 1

## 2015-08-04 MED ORDER — HALOPERIDOL LACTATE 5 MG/ML IJ SOLN
5.0000 mg | Freq: Once | INTRAMUSCULAR | Status: AC
  Administered 2015-08-04: 5 mg via INTRAVENOUS

## 2015-08-04 MED ORDER — NALOXONE HCL 2 MG/2ML IJ SOSY
0.4000 mg | PREFILLED_SYRINGE | Freq: Once | INTRAMUSCULAR | Status: AC
  Administered 2015-08-04: 0.4 mg via INTRAVENOUS
  Filled 2015-08-04: qty 2

## 2015-08-04 NOTE — ED Notes (Signed)
Patient states he took percocet and antibiotics today but he states "I don't know why I took them".

## 2015-08-04 NOTE — ED Provider Notes (Signed)
[facility] Emergency Department Provider Note  Time seen: 11:12 PM  I have reviewed the triage vital signs and the nursing notes.   HISTORY  Chief Complaint Drug Overdose    HPI Rodney Velasquez is a 44 y.o. male with a past medical history of COPD, back pain, seizure disorder, presents the emergency department after a potential overdose. According to the patient and report the patient's family member recently passed away, he was with his friends (possible girlfriend) tonight driving back from the beach. Patient states they were getting into arguments, and the patient got out of the car or skipped out of the car. At some point the police were called, but the patient states he is given the option of going back in the car with his girlfriend or being arrested suddenly went back in the car. States he was upset, and he took 3 7.5 mg Percocets, several antibiotics, and vitamin B. Cover brought the patient to the emergency department, upon arrival patient very somnolent, largely responsive and was given Narcan with significant improvement to the point where the patient became agitated, and required Haldol. Upon my exam the patient somnolent, when asked why he took the pills, he states he does not know. However he does state he is not trying to kill himself. Denies any medical complaints.    Past Medical History  Diagnosis Date  . Kidney stones   . COPD (chronic obstructive pulmonary disease) ([place])   . Ruptured lumbar disc   . Renal stone 12/14/12  . Seizures ([place])   . Migraine   . Asthma   . Enlarged heart   . Poor circulation   . Brain tumor ([place])     There are no active problems to display for this patient.   Past Surgical History  Procedure Laterality Date  . Knee surgery    . Cardiac catheterization      Current Outpatient Rx  Name  Route  Sig  Dispense  Refill  . oxyCODONE-acetaminophen (PERCOCET) 7.5-325 MG tablet   Oral   Take 1 tablet by mouth every 6  (six) hours as needed for severe pain. And 1 at bedtime         . amoxicillin-clavulanate (AUGMENTIN) 875-125 MG tablet   Oral   Take 1 tablet by mouth 2 (two) times daily. Patient not taking: Reported on 08/04/2015   14 tablet   0   . beclomethasone (QVAR) 80 MCG/ACT inhaler   Inhalation   Inhale 1 puff into the lungs as needed (Shortness of breath).         Marland Kitchen buPROPion (WELLBUTRIN XL) 150 MG 24 hr tablet   Oral   Take 150 mg by mouth daily.         . clonazePAM (KLONOPIN) 0.5 MG tablet   Oral   Take 0.5 mg by mouth 3 (three) times daily.          Marland Kitchen desvenlafaxine (PRISTIQ) 50 MG 24 hr tablet   Oral   Take 50 mg by mouth daily.         Marland Kitchen etodolac (LODINE) 400 MG tablet   Oral   Take 400 mg by mouth 2 (two) times daily.         Marland Kitchen gabapentin (NEURONTIN) 400 MG capsule   Oral   Take 400 mg by mouth 3 (three) times daily.         . meloxicam (MOBIC) 15 MG tablet   Oral   Take 1 tablet (15  mg total) by mouth daily. Patient not taking: Reported on 08/04/2015   30 tablet   0   . tiotropium (SPIRIVA) 18 MCG inhalation capsule   Inhalation   Place 18 mcg into inhaler and inhale daily.         . traMADol (ULTRAM) 50 MG tablet   Oral   Take 50 mg by mouth every 6 (six) hours as needed for pain.           Allergies Review of patient's allergies indicates no known allergies.  No family history on file.  Social History Social History  Substance Use Topics  . Smoking status: Heavy Tobacco Smoker -- 2.50 packs/day    Types: Cigarettes  . Smokeless tobacco: None  . Alcohol Use: Yes     Comment: occasional    Review of Systems Constitutional: Negative for fever. Cardiovascular: Negative for chest pain. Respiratory: Negative for shortness of breath. Gastrointestinal: Negative for abdominal pain, denies nausea or vomiting. Neurological: Negative for headache 10-point ROS otherwise  negative.  ____________________________________________   PHYSICAL EXAM:  VITAL SIGNS: ED Triage Vitals  Enc Vitals Group     BP 08/04/15 2230 185/91 mmHg     Pulse Rate 08/04/15 2230 62     Resp 08/04/15 2243 17     Temp 08/04/15 2243 97.5 F (36.4 C)     Temp Source 08/04/15 2243 Oral     SpO2 08/04/15 2230 92 %     Weight 08/04/15 2243 265 lb (120.203 kg)     Height 08/04/15 2243 5\' 7"  (1.702 m)     Head Cir --      Peak Flow --      Pain Score --      Pain Loc --      Pain Edu? --      Excl. in [place]? --    Constitutional: Alert but somnolent. No distress. Agitated at times. Eyes: Normal exam, 3 mm PERRL bilaterally. ENT   Head: Normocephalic and atraumatic.   Mouth/Throat: Mucous membranes are moist. Cardiovascular: Normal rate, regular rhythm. Respiratory: Normal respiratory effort without tachypnea nor retractions. Breath sounds are clear  Gastrointestinal: Soft and nontender. No distention. Musculoskeletal: Nontender with normal range of motion in all extremities. Atraumatic appearing. Neurologic:  Slurred speech, somnolence, moves all extremities. Skin:  Skin is warm, dry and intact.  Psychiatric: Mostly somnolent, agitated at times.   ____________________________________________    EKG  EKG reviewed and interpreted by myself shows normal sinus rhythm at 62 bpm, narrow QRS, normal axis, normal intervals, no ST changes. Overall normal EKG.  ____________________________________________    INITIAL IMPRESSION / ASSESSMENT AND PLAN / ED COURSE  Pertinent labs & imaging results that were available during my care of the patient were reviewed by me and considered in my medical decision making (see chart for details).  Patient presents the emergency department with possible intentional overdose of medications to the point that he required Narcan.  He then became agitated, saying he was going to leave, requiring Haldol for sedation. Given likely intentional  overdose, with admitted depression, recent death in the family, and domestic argument, I placed the patient under an involuntary commitment. We'll closely monitor in the emergency department obtain labs, and have psychiatry evaluate in the morning.   Blood work largely within normal limits, currently awaiting urine lab results. Pending psychiatric evaluation/disposition. ____________________________________________   FINAL CLINICAL IMPRESSION(S) / ED DIAGNOSES  Overdose   Harvest Dark, MD 08/05/15 [phone]

## 2015-08-04 NOTE — [facility] Counselor (Signed)
Pt heavily sedated, could not complete TTS assessment.

## 2015-08-04 NOTE — ED Notes (Signed)
Pt comes into the Ed via POV with a friend.  According to the patient's friend, he took too many percocets.  Patient has recently lost his brother and has been staying with a friend.  Patient states he took 5 7.5-325 percocet today.  No respiratory distress at this time.  Patient not alert  when brought back to the room.

## 2015-08-04 NOTE — ED Notes (Signed)
MD at bedside. 

## 2015-08-05 DIAGNOSIS — T452X4A Poisoning by vitamins, undetermined, initial encounter: Secondary | ICD-10-CM | POA: Diagnosis not present

## 2015-08-05 NOTE — ED Notes (Addendum)
BEHAVIORAL HEALTH ROUNDING Patient sleeping: NO Patient alert and oriented: YES Behavior appropriate: YES Nutrition and fluids offered: YES Toileting and hygiene offered: YES Sitter present: YES Event organiser present: YES  .Pt resting quietly with eyes closed in darkened exam room with no distress noted; awakens easily; ED tech, Willow Ora, in room to change pt into behav scrubs; clothing secured on nursing unit; pt with no c/o at this time; card monitor in place

## 2015-08-05 NOTE — ED Notes (Addendum)
Pt transferred into ED quad  Patient assigned to appropriate care area. Patient oriented to unit/care area: - plan of care discussed - psych consult pending   Visiting hours and phone times explained to patient. Patient verbalizes understanding, and verbal contract for safety obtained.

## 2015-08-05 NOTE — ED Notes (Signed)
Lunch provided along with an extra drink  Pt observed with no unusual behavior  Appropriate to stimulation  No verbalized needs or concerns at this time  NAD assessed  Continue to monitor 

## 2015-08-05 NOTE — ED Notes (Signed)
BEHAVIORAL HEALTH ROUNDING Patient sleeping: Yes.   Patient alert and oriented: eyes closed  Appears asleep Behavior appropriate: Yes.  ; If no, describe:  Nutrition and fluids offered: Yes  Toileting and hygiene offered: sleeping Sitter present: q 15 minute observations and security camera monitoring Law enforcement present: yes  ODS 

## 2015-08-05 NOTE — ED Notes (Signed)
BEHAVIORAL HEALTH ROUNDING Patient sleeping: NO Patient alert and oriented: YES Behavior appropriate: YES Nutrition and fluids offered: YES Toileting and hygiene offered: YES Sitter present: YES Law enforcement present: YES 

## 2015-08-05 NOTE — Discharge Instructions (Signed)
Nontoxic Ingestion Nontoxic ingestion means that the substance you have swallowed (ingested) is not likely to cause serious medical problems. Further treatment is not needed at this time. However, the effects of drugs or other substances can sometimes be delayed, so you should watch your condition for any changes. HOME CARE INSTRUCTIONS  Take medicines only as directed by your health care provider.  Follow instructions from your health care provider about eating or drinking restrictions. If you have vomited since ingesting the substance, you may need to avoid eating or drinking for a few hours. After that, you can start with small sips of clear liquids until your stomach settles.  Do not drink alcohol or use illegal drugs.  Keep all follow-up visits as directed by your health care provider. This is important. SEEK MEDICAL CARE IF:  You have a fever.  You have vomiting. SEEK IMMEDIATE MEDICAL CARE IF:  You have difficulty walking.  You have confusion or agitation.  You are overly tired.  You have difficulty breathing.  You have difficulty swallowing or you have a lot of mucus.  You have a seizure.  You have profuse sweating.  You have a cough.  You have abdominal pain, repeated vomiting, or severe diarrhea.  You have weakness.  You have a fast or irregular heartbeat (palpitations).  You have signs of dehydration, such as:  Severe thirst.  Dry lips and mouth.  Dizziness.  Dark urine or a decreasing need to urinate.  Rapid breathing or rapid pulse.   This information is not intended to replace advice given to you by your health care provider. Make sure you discuss any questions you have with your health care provider.   Document Released: 11/09/2004 Document Revised: 02/16/2015 Document Reviewed: 08/26/2014 Elsevier Interactive Patient Education [employer].

## 2015-08-05 NOTE — ED Notes (Signed)
BEHAVIORAL HEALTH ROUNDING Patient sleeping: No. Patient alert and oriented: yes Behavior appropriate: Yes.  ; If no, describe:  Nutrition and fluids offered: Yes  Toileting and hygiene offered: Yes  Sitter present: yes Law enforcement present: Yes  ENVIRONMENTAL ASSESSMENT Potentially harmful objects out of patient reach: Yes.   Personal belongings secured: Yes.   Patient dressed in hospital provided attire only: Yes.   Plastic bags out of patient reach: Yes.   Patient care equipment (cords, cables, call bells, lines, and drains) shortened, removed, or accounted for: No, pt actively being monitored at bedside. All cords within visual sight of sitter. Equipment and supplies removed from bottom of stretcher: Yes.   Potentially toxic materials out of patient reach: Yes.   Sharps container removed or out of patient reach: Yes.

## 2015-08-05 NOTE — ED Notes (Signed)
2/2 bags of belongings returned to him and he verbalizes that he has received back all items that he came here with   Discharge instructions reviewed with him and he verbalizes agreement and understanding

## 2015-08-05 NOTE — ED Notes (Signed)
BEHAVIORAL HEALTH ROUNDING Patient sleeping: No. Patient alert and oriented: yes Behavior appropriate: Yes.  ; If no, describe:  Nutrition and fluids offered: yes Toileting and hygiene offered: Yes  Sitter present: q15 minute observations and security camera monitoring Law enforcement present: Yes  ODS  
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
## 2015-08-05 NOTE — ED Notes (Signed)
BEHAVIORAL HEALTH ROUNDING Patient sleeping: NO Patient alert and oriented: YES Behavior appropriate: YES Nutrition and fluids offered: YES Toileting and hygiene offered: YES Sitter present: YES Event organiser present: YES q

## 2015-08-05 NOTE — ED Provider Notes (Signed)
Patient is requesting to leave. I do not find any criteria for psychiatric admission at this time. He is not suicidal or homicidal. Patient states he took too much pain medication but was not doing so to harm himself. I will overturn the IVC and discharge him from the ER.  Earleen Newport, MD 08/05/15 [phone]

## 2015-08-05 NOTE — ED Notes (Signed)
ED BHU [place] Is the patient under IVC or is there intent for IVC: Yes.   Is the patient medically cleared: Yes.   Is there vacancy in the ED BHU: Yes.   Is the population mix appropriate for patient: Yes.   Is the patient awaiting placement in inpatient or outpatient setting: No. Has the patient had a psychiatric consult: No  Consult pending Survey of unit performed for contraband, proper placement and condition of furniture, tampering with fixtures in bathroom, shower, and each patient room: Yes.  ; Findings:  APPEARANCE/BEHAVIOR Calm and cooperative NEURO ASSESSMENT Orientation: oriented x3  Denies pain Hallucinations: No.None noted (Hallucinations) Speech: Normal Gait: normal RESPIRATORY ASSESSMENT even unlabored respirations  CARDIOVASCULAR ASSESSMENT Pulses equal  regular rate  Skin warm and dry GASTROINTESTINAL ASSESSMENT no GI complaint EXTREMITIES Full ROM PLAN OF CARE Provide calm/safe environment. Vital signs assessed twice daily. ED BHU Assessment once each 12-hour shift. Collaborate with intake RN daily or as condition indicates. Assure the ED provider has rounded once each shift. Provide and encourage hygiene. Provide redirection as needed. Assess for escalating behavior; address immediately and inform ED provider.  Assess family dynamic and appropriateness for visitation as needed: Yes.  ; If necessary, describe findings:  Educate the patient/family about BHU procedures/visitation: Yes.  ; If necessary, describe findings:

## 2016-09-07 ENCOUNTER — Emergency Department: Payer: Medicare Other

## 2016-09-07 ENCOUNTER — Emergency Department
Admission: EM | Admit: 2016-09-07 | Discharge: 2016-09-08 | Disposition: A | Discharge status: Home / Self Care | Payer: Medicare Other | Attending: Emergency Medicine | Admitting: Emergency Medicine

## 2016-09-07 ENCOUNTER — Encounter: Payer: Self-pay | Admitting: *Deleted

## 2016-09-07 DIAGNOSIS — J449 Chronic obstructive pulmonary disease, unspecified: Secondary | ICD-10-CM | POA: Insufficient documentation

## 2016-09-07 DIAGNOSIS — N451 Epididymitis: Secondary | ICD-10-CM | POA: Diagnosis not present

## 2016-09-07 DIAGNOSIS — Z85841 Personal history of malignant neoplasm of brain: Secondary | ICD-10-CM | POA: Insufficient documentation

## 2016-09-07 DIAGNOSIS — J45909 Unspecified asthma, uncomplicated: Secondary | ICD-10-CM | POA: Insufficient documentation

## 2016-09-07 DIAGNOSIS — R109 Unspecified abdominal pain: Secondary | ICD-10-CM

## 2016-09-07 DIAGNOSIS — F17210 Nicotine dependence, cigarettes, uncomplicated: Secondary | ICD-10-CM | POA: Diagnosis not present

## 2016-09-07 DIAGNOSIS — Z79899 Other long term (current) drug therapy: Secondary | ICD-10-CM | POA: Diagnosis not present

## 2016-09-07 DIAGNOSIS — R10A0 Flank pain, unspecified side: Secondary | ICD-10-CM

## 2016-09-07 LAB — COMPREHENSIVE METABOLIC PANEL
ALBUMIN: 4.5 g/dL (ref 3.5–5.0)
ALK PHOS: 76 U/L (ref 38–126)
ALT: 19 U/L (ref 17–63)
ANION GAP: 11 (ref 5–15)
AST: 25 U/L (ref 15–41)
BUN: 21 mg/dL — ABNORMAL HIGH (ref 6–20)
CALCIUM: 9.5 mg/dL (ref 8.9–10.3)
CHLORIDE: 96 mmol/L — AB (ref 101–111)
CO2: 27 mmol/L (ref 22–32)
Creatinine, Ser: 1.09 mg/dL (ref 0.61–1.24)
GFR calc Af Amer: >60 mL/min (ref >60)
GFR calc non Af Amer: >60 mL/min (ref >60)
GLUCOSE: 95 mg/dL (ref 65–99)
Potassium: 3.9 mmol/L (ref 3.5–5.1)
SODIUM: 134 mmol/L — AB (ref 135–145)
Total Bilirubin: 0.8 mg/dL (ref 0.3–1.2)
Total Protein: 7.9 g/dL (ref 6.5–8.1)

## 2016-09-07 LAB — URINE DRUG SCREEN, QUALITATIVE (ARMC ONLY)
AMPHETAMINES, UR SCREEN: NOT DETECTED
BENZODIAZEPINE, UR SCRN: NOT DETECTED
Barbiturates, Ur Screen: NOT DETECTED
CANNABINOID 50 NG, UR ~~LOC~~: POSITIVE — AB
Cocaine Metabolite,Ur ~~LOC~~: NOT DETECTED
MDMA (Ecstasy)Ur Screen: NOT DETECTED
Methadone Scn, Ur: NOT DETECTED
OPIATE, UR SCREEN: NOT DETECTED
PHENCYCLIDINE (PCP) UR S: NOT DETECTED
Tricyclic, Ur Screen: NOT DETECTED

## 2016-09-07 LAB — CBC WITH DIFFERENTIAL/PLATELET
BASOS PCT: 1 %
Basophils Absolute: 0.1 10*3/uL (ref 0–0.1)
Eosinophils Absolute: 0.7 10*3/uL (ref 0–0.7)
Eosinophils Relative: 6 %
HEMATOCRIT: 49.9 % (ref 40.0–52.0)
Hemoglobin: 17.1 g/dL (ref 13.0–18.0)
LYMPHS ABS: 2.2 10*3/uL (ref 1.0–3.6)
LYMPHS PCT: 19 %
MCH: 27.5 pg (ref 26.0–34.0)
MCHC: 34.3 g/dL (ref 32.0–36.0)
MCV: 80.1 fL (ref 80.0–100.0)
MONO ABS: 1.4 10*3/uL — AB (ref 0.2–1.0)
MONOS PCT: 11 %
NEUTROS ABS: 7.6 10*3/uL — AB (ref 1.4–6.5)
Neutrophils Relative %: 63 %
Platelets: 163 10*3/uL (ref 150–440)
RBC: 6.23 MIL/uL — ABNORMAL HIGH (ref 4.40–5.90)
RDW: 13.7 % (ref 11.5–14.5)
WBC: 12 10*3/uL — ABNORMAL HIGH (ref 3.8–10.6)

## 2016-09-07 LAB — URINALYSIS COMPLETE WITH MICROSCOPIC (ARMC ONLY)
BILIRUBIN URINE: NEGATIVE
Glucose, UA: NEGATIVE mg/dL
KETONES UR: NEGATIVE mg/dL
NITRITE: NEGATIVE
PH: 5 (ref 5.0–8.0)
Protein, ur: 30 mg/dL — AB
SPECIFIC GRAVITY, URINE: 1.014 (ref 1.005–1.030)

## 2016-09-07 MED ORDER — LEVOFLOXACIN 750 MG PO TABS: 750.0000 mg | ORAL_TABLET | Freq: Every day | ORAL | 0 refills | Status: AC

## 2016-09-07 MED ORDER — MORPHINE SULFATE (PF) 4 MG/ML IV SOLN
4.0000 mg | Freq: Once | INTRAVENOUS | Status: AC
  Administered 2016-09-07: 4 mg via INTRAVENOUS

## 2016-09-07 MED ORDER — ONDANSETRON HCL 4 MG/2ML IJ SOLN
4.0000 mg | Freq: Once | INTRAMUSCULAR | Status: AC
  Administered 2016-09-07: 4 mg via INTRAVENOUS

## 2016-09-07 MED ORDER — KETOROLAC TROMETHAMINE 30 MG/ML IJ SOLN
30.0000 mg | Freq: Once | INTRAMUSCULAR | Status: AC
  Administered 2016-09-07: 30 mg via INTRAVENOUS
  Filled 2016-09-07: qty 1

## 2016-09-07 MED ORDER — ONDANSETRON HCL 4 MG/2ML IJ SOLN
INTRAMUSCULAR | Status: AC
  Administered 2016-09-07: 4 mg via INTRAVENOUS
  Filled 2016-09-07: qty 2

## 2016-09-07 MED ORDER — MORPHINE SULFATE (PF) 4 MG/ML IV SOLN
INTRAVENOUS | Status: AC
  Administered 2016-09-07: 4 mg via INTRAVENOUS
  Filled 2016-09-07: qty 1

## 2016-09-07 MED ORDER — OXYCODONE-ACETAMINOPHEN 5-325 MG PO TABS: 1.0000 | ORAL_TABLET | Freq: Four times a day (QID) | ORAL | 0 refills | Status: AC | PRN

## 2016-09-07 MED ORDER — CIPROFLOXACIN HCL 500 MG PO TABS
500.0000 mg | ORAL_TABLET | Freq: Once | ORAL | Status: AC
  Administered 2016-09-08: 500 mg via ORAL
  Filled 2016-09-07: qty 1

## 2016-09-07 MED ORDER — SODIUM CHLORIDE 0.9 % IV BOLUS (SEPSIS)
1000.0000 mL | Freq: Once | INTRAVENOUS | Status: AC
  Administered 2016-09-07: 1000 mL via INTRAVENOUS

## 2016-09-07 NOTE — ED Notes (Signed)
Patient transported to CT 

## 2016-09-07 NOTE — ED Triage Notes (Addendum)
Pt to triage via wheelchair.  Pt has right swollen testicle for 3 days, pt has right side flank pain with nausea and vomiting, decreased appetite.  Hx of kidney stones.

## 2016-09-07 NOTE — ED Provider Notes (Addendum)
[facility] Emergency Department Provider Note  ____________________________________________   I have reviewed the triage vital signs and the nursing notes.   HISTORY  Chief Complaint Flank Pain    HPI Rodney Velasquez is a 45 y.o. male patient is a very limited historian. He states that he has had chronic kidney stones and he has had kidney stones for some time on the left. He states that he has chronic pain in that area. He states that his chronic kidney stone pain seems to have gotten worse with radiation to his groin. He also complains of right testicular swelling. He states this started at the same time. However, he can't really remember how long he has been having left-sided flank pain. Sometimes he tells me has been months, and other times he tells me is just today. He states is "not too long". In any event, patient has testicular pain without any trauma known. He believes it to be connected to his kidney stones. He states he did pass a kidney stone earlier today. He is not sure if he still has one.  Pain is sharp and nothing makes it better and moving makes it worse.  Past Medical History:  Diagnosis Date  . Asthma   . Brain tumor ([place])   . COPD (chronic obstructive pulmonary disease) ([place])   . Enlarged heart   . Kidney stones   . Migraine   . Poor circulation   . Renal stone 12/14/12  . Ruptured lumbar disc   . Seizures ([place])     There are no active problems to display for this patient.   Past Surgical History:  Procedure Laterality Date  . CARDIAC CATHETERIZATION    . KNEE SURGERY      Prior to Admission medications   Medication Sig Start Date End Date Taking? Authorizing Provider  amoxicillin-clavulanate (AUGMENTIN) 875-125 MG tablet Take 1 tablet by mouth 2 (two) times daily. Patient not taking: Reported on 08/04/2015 07/17/15   Charline Bills Cuthriell, PA-C  amphetamine-dextroamphetamine (ADDERALL) 20 MG tablet TAKE 1 TO 1 AND 1/2 TABLETS BY  MOUTH EVERY AFTERNOON AS DIRECTED 06/03/15   Historical Provider, MD  amphetamine-dextroamphetamine (ADDERALL) 30 MG tablet Take 1 tablet by mouth 2 (two) times daily. 07/01/15   Historical Provider, MD  azelastine (ASTELIN) 0.1 % nasal spray 2SPR NASAL TWO TIMES A DAY 07/07/15   Historical Provider, MD  BREO ELLIPTA 100-25 MCG/INH AEPB Take 1 puff by mouth daily.  07/22/15   Historical Provider, MD  buPROPion (WELLBUTRIN XL) 150 MG 24 hr tablet Take 150 mg by mouth daily.    Historical Provider, MD  clonazePAM (KLONOPIN) 1 MG tablet Take 1 tablet by mouth 3 (three) times daily. 07/02/15   Historical Provider, MD  DULoxetine (CYMBALTA) 60 MG capsule Take 60 mg by mouth daily. 06/16/15   Historical Provider, MD  fluticasone Asencion Islam) 50 MCG/ACT nasal spray  07/22/15   Historical Provider, MD  gabapentin (NEURONTIN) 300 MG capsule Take 600 mg by mouth 3 (three) times daily. 07/07/15   Historical Provider, MD  levothyroxine (SYNTHROID, LEVOTHROID) 50 MCG tablet Take 50 mcg by mouth daily. 07/07/15   Historical Provider, MD  meloxicam (MOBIC) 15 MG tablet Take 1 tablet (15 mg total) by mouth daily. Patient not taking: Reported on 08/04/2015 07/17/15   Charline Bills Cuthriell, PA-C  omeprazole (PRILOSEC) 20 MG capsule Take 1 capsule by mouth 2 (two) times daily. 07/20/15   Historical Provider, MD  oxyCODONE-acetaminophen (PERCOCET) 7.5-325 MG tablet Take 1  tablet by mouth every 6 (six) hours as needed for severe pain. And 1 at bedtime    Historical Provider, MD  topiramate (TOPAMAX) 25 MG tablet Take 25 mg by mouth 3 (three) times daily. 07/22/15   Historical Provider, MD  triamterene-hydrochlorothiazide (MAXZIDE) 75-50 MG tablet Take 1 tablet by mouth daily. 07/07/15   Historical Provider, MD  VASCEPA 1 G CAPS Take 2 capsules by mouth 2 (two) times daily. 05/17/15   Historical Provider, MD  venlafaxine XR (EFFEXOR-XR) 150 MG 24 hr capsule Take 150 mg by mouth daily. 06/18/15   Historical Provider, MD  VENTOLIN HFA 108 (90  BASE) MCG/ACT inhaler Take 2 puffs by mouth every 4 (four) hours. 07/22/15   Historical Provider, MD    Allergies Patient has no known allergies.  No family history on file.  Social History Social History  Substance Use Topics  . Smoking status: Heavy Tobacco Smoker    Packs/day: 2.50    Types: Cigarettes  . Smokeless tobacco: Never Used  . Alcohol use Yes     Comment: occasional    Review of Systems Constitutional: No fever/chills Eyes: No visual changes. ENT: No sore throat. No stiff neck no neck pain Cardiovascular: Denies chest pain. Respiratory: Denies shortness of breath. Gastrointestinal:   no vomiting.  No diarrhea.  No constipation. Genitourinary: Negative for dysuria. Musculoskeletal: Negative lower extremity swelling Skin: Negative for rash. Neurological: Negative for severe headaches, focal weakness or numbness. 10-point ROS otherwise negative.  ____________________________________________   PHYSICAL EXAM:  VITAL SIGNS: ED Triage Vitals  Enc Vitals Group     BP 09/07/16 2047 (!) 158/90     Pulse Rate 09/07/16 2047 90     Resp 09/07/16 2047 (!) 22     Temp 09/07/16 2047 98.9 F (37.2 C)     Temp Source 09/07/16 2047 Oral     SpO2 09/07/16 2047 98 %     Weight 09/07/16 2048 260 lb (117.9 kg)     Height 09/07/16 2048 5\' 6"  (1.676 m)     Head Circumference --      Peak Flow --      Pain Score 09/07/16 2055 8     Pain Loc --      Pain Edu? --      Excl. in [place]? --     Constitutional: Alert and oriented. Well appearing and in no acute distress. Eyes: Conjunctivae are normal. PERRL. EOMI. Head: Atraumatic. Nose: No congestion/rhinnorhea. Mouth/Throat: Mucous membranes are moist.  Oropharynx non-erythematous. Neck: No stridor.   Nontender with no meningismus Cardiovascular: Normal rate, regular rhythm. Grossly normal heart sounds.  Good peripheral circulation. Respiratory: Normal respiratory effort.  No retractions. Lungs CTAB. Abdominal: Soft and  nontender. No distention. No guarding no rebound Back:  There is no focal tenderness or step off.  there is no midline tenderness there are no lesions noted. there is Mild left CVA tenderness GU: Normal penis no discharge or lesions. No scrotal erythema or evidence of infection. He has moderate tenderness to the right testicle which appears slightly possibly larger than the left. Cremasteric reflexes intact. Musculoskeletal: No lower extremity tenderness, no upper extremity tenderness. No joint effusions, no DVT signs strong distal pulses no edema Neurologic:  Normal speech and language. No gross focal neurologic deficits are appreciated.  Skin:  Skin is warm, dry and intact. No rash noted. Psychiatric: Mood and affect are normal. Speech and behavior are normal.  ____________________________________________   LABS (all labs ordered are listed, but only  abnormal results are displayed)  Labs Reviewed  COMPREHENSIVE METABOLIC PANEL - Abnormal; Notable for the following:       Result Value   Sodium 134 (*)    Chloride 96 (*)    BUN 21 (*)    All other components within normal limits  CBC WITH DIFFERENTIAL/PLATELET - Abnormal; Notable for the following:    WBC 12.0 (*)    RBC 6.23 (*)    Neutro Abs 7.6 (*)    Monocytes Absolute 1.4 (*)    All other components within normal limits  URINALYSIS COMPLETEWITH MICROSCOPIC ([facility] ONLY) - Abnormal; Notable for the following:    Color, Urine YELLOW (*)    APPearance CLEAR (*)    Hgb urine dipstick 3+ (*)    Protein, ur 30 (*)    Leukocytes, UA TRACE (*)    Bacteria, UA RARE (*)    Squamous Epithelial / LPF 0-5 (*)    All other components within normal limits  URINE DRUG SCREEN, QUALITATIVE ([facility] ONLY) - Abnormal; Notable for the following:    Cannabinoid 50 Ng, Ur [place] POSITIVE (*)    All other components within normal limits  URINE CULTURE  CHLAMYDIA/NGC RT PCR ([facility] ONLY)   ____________________________________________  EKG  I  personally interpreted any EKGs ordered by me or triage  ____________________________________________  RADIOLOGY  I reviewed any imaging ordered by me or triage that were performed during my shift and, if possible, patient and/or family made aware of any abnormal findings. ____________________________________________   PROCEDURES  Procedure(s) performed: None  Procedures  Critical Care performed: None  ____________________________________________   INITIAL IMPRESSION / ASSESSMENT AND PLAN / ED COURSE  Pertinent labs & imaging results that were available during my care of the patient were reviewed by me and considered in my medical decision making (see chart for details).  Patient with 2 complaints. The first is flank pain. He may well pass a stone today does have hematuria, and there is evidence of blood in his urine. However he also has testicular pain which clearly is an unrelated event. The ultrasound of his testicle does show a possible epididymitis. No discharge and given his age nothing at this time to suggest STI. We will treat however with Cipro and send gonorrhea and chlamydia cultures on his urine. There is no evidence of testicular torsion or mass. Given that he is over 66, fluoroquinolone dictation should be adequate pending culture. Patient denies history of rectal intercourse.  ----------------------------------------- 11:47 PM on 09/07/2016 -----------------------------------------  Asian states he has had STI testing because of his pain in his been negative, recent, he also states that he is not socially active. He feels much better at this time. We'll send him home with appropriate antibiotics and culture and follow-up  Clinical Course    ____________________________________________   FINAL CLINICAL IMPRESSION(S) / ED DIAGNOSES  Final diagnoses:  None      This chart was dictated using voice recognition software.  Despite best efforts to proofread,   errors can occur which can change meaning.      Schuyler Amor, MD 09/07/16 [place], MD 09/07/16 [phone]

## 2016-09-08 DIAGNOSIS — N451 Epididymitis: Secondary | ICD-10-CM | POA: Diagnosis not present

## 2016-09-08 LAB — CHLAMYDIA/NGC RT PCR (ARMC ONLY)
Chlamydia Tr: NOT DETECTED
N gonorrhoeae: NOT DETECTED

## 2016-09-10 LAB — URINE CULTURE: Culture: >=100000 — AB

## 2017-02-19 ENCOUNTER — Emergency Department: Payer: Medicare Other

## 2017-02-19 ENCOUNTER — Emergency Department
Admission: EM | Admit: 2017-02-19 | Discharge: 2017-02-19 | Disposition: A | Discharge status: Home / Self Care | Payer: Medicare Other | Attending: Emergency Medicine | Admitting: Emergency Medicine

## 2017-02-19 ENCOUNTER — Other Ambulatory Visit: Payer: Self-pay

## 2017-02-19 ENCOUNTER — Encounter: Payer: Self-pay | Admitting: Emergency Medicine

## 2017-02-19 DIAGNOSIS — J4541 Moderate persistent asthma with (acute) exacerbation: Secondary | ICD-10-CM | POA: Insufficient documentation

## 2017-02-19 DIAGNOSIS — J449 Chronic obstructive pulmonary disease, unspecified: Secondary | ICD-10-CM | POA: Diagnosis not present

## 2017-02-19 DIAGNOSIS — R0602 Shortness of breath: Secondary | ICD-10-CM | POA: Diagnosis present

## 2017-02-19 DIAGNOSIS — Z79899 Other long term (current) drug therapy: Secondary | ICD-10-CM | POA: Diagnosis not present

## 2017-02-19 DIAGNOSIS — F17210 Nicotine dependence, cigarettes, uncomplicated: Secondary | ICD-10-CM | POA: Diagnosis not present

## 2017-02-19 LAB — CBC WITH DIFFERENTIAL/PLATELET
Basophils Absolute: 0.1 10*3/uL (ref 0–0.1)
Basophils Relative: 1 %
Eosinophils Absolute: 0.2 10*3/uL (ref 0–0.7)
Eosinophils Relative: 1 %
HEMATOCRIT: 49.1 % (ref 40.0–52.0)
HEMOGLOBIN: 16.5 g/dL (ref 13.0–18.0)
LYMPHS PCT: 9 %
Lymphs Abs: 1.1 10*3/uL (ref 1.0–3.6)
MCH: 27.5 pg (ref 26.0–34.0)
MCHC: 33.6 g/dL (ref 32.0–36.0)
MCV: 82 fL (ref 80.0–100.0)
Monocytes Absolute: 0.5 10*3/uL (ref 0.2–1.0)
Monocytes Relative: 4 %
NEUTROS ABS: 10.1 10*3/uL — AB (ref 1.4–6.5)
Neutrophils Relative %: 85 %
PLATELETS: 145 10*3/uL — AB (ref 150–440)
RBC: 5.99 MIL/uL — AB (ref 4.40–5.90)
RDW: 13.4 % (ref 11.5–14.5)
WBC: 11.9 10*3/uL — AB (ref 3.8–10.6)

## 2017-02-19 LAB — COMPREHENSIVE METABOLIC PANEL
ALT: 14 U/L — AB (ref 17–63)
AST: 24 U/L (ref 15–41)
Albumin: 4.7 g/dL (ref 3.5–5.0)
Alkaline Phosphatase: 70 U/L (ref 38–126)
Anion gap: 11 (ref 5–15)
BILIRUBIN TOTAL: 1.5 mg/dL — AB (ref 0.3–1.2)
BUN: 13 mg/dL (ref 6–20)
CHLORIDE: 102 mmol/L (ref 101–111)
CO2: 25 mmol/L (ref 22–32)
CREATININE: 0.85 mg/dL (ref 0.61–1.24)
Calcium: 9.4 mg/dL (ref 8.9–10.3)
GFR calc Af Amer: >60 mL/min (ref >60)
Glucose, Bld: 101 mg/dL — ABNORMAL HIGH (ref 65–99)
Potassium: 3.6 mmol/L (ref 3.5–5.1)
Sodium: 138 mmol/L (ref 135–145)
Total Protein: 7.6 g/dL (ref 6.5–8.1)

## 2017-02-19 MED ORDER — PREDNISONE 10 MG (21) PO TBPK: ORAL_TABLET | Freq: Every day | ORAL | 0 refills | Status: AC

## 2017-02-19 MED ORDER — IPRATROPIUM-ALBUTEROL 0.5-2.5 (3) MG/3ML IN SOLN
RESPIRATORY_TRACT | Status: AC
  Filled 2017-02-19: qty 3

## 2017-02-19 MED ORDER — IPRATROPIUM-ALBUTEROL 0.5-2.5 (3) MG/3ML IN SOLN
3.0000 mL | Freq: Once | RESPIRATORY_TRACT | Status: AC
  Administered 2017-02-19: 3 mL via RESPIRATORY_TRACT
  Filled 2017-02-19: qty 3

## 2017-02-19 MED ORDER — IPRATROPIUM-ALBUTEROL 0.5-2.5 (3) MG/3ML IN SOLN
3.0000 mL | Freq: Once | RESPIRATORY_TRACT | Status: AC
  Administered 2017-02-19: 3 mL via RESPIRATORY_TRACT

## 2017-02-19 MED ORDER — METHYLPREDNISOLONE SODIUM SUCC 125 MG IJ SOLR
125.0000 mg | Freq: Once | INTRAMUSCULAR | Status: AC
  Administered 2017-02-19: 125 mg via INTRAVENOUS

## 2017-02-19 MED ORDER — METHYLPREDNISOLONE SODIUM SUCC 125 MG IJ SOLR
125.0000 mg | Freq: Once | INTRAMUSCULAR | Status: DC
  Filled 2017-02-19: qty 2
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
## 2017-06-13 ENCOUNTER — Emergency Department
Admission: EM | Admit: 2017-06-13 | Discharge: 2017-06-14 | Disposition: A | Discharge status: Home / Self Care | Payer: Medicare Other | Attending: Emergency Medicine | Admitting: Emergency Medicine

## 2017-06-13 ENCOUNTER — Encounter: Payer: Self-pay | Admitting: Emergency Medicine

## 2017-06-13 DIAGNOSIS — R112 Nausea with vomiting, unspecified: Secondary | ICD-10-CM | POA: Diagnosis not present

## 2017-06-13 DIAGNOSIS — B372 Candidiasis of skin and nail: Secondary | ICD-10-CM | POA: Insufficient documentation

## 2017-06-13 DIAGNOSIS — Z87891 Personal history of nicotine dependence: Secondary | ICD-10-CM | POA: Insufficient documentation

## 2017-06-13 DIAGNOSIS — M791 Myalgia, unspecified site: Secondary | ICD-10-CM

## 2017-06-13 DIAGNOSIS — J45909 Unspecified asthma, uncomplicated: Secondary | ICD-10-CM | POA: Insufficient documentation

## 2017-06-13 DIAGNOSIS — M2550 Pain in unspecified joint: Secondary | ICD-10-CM | POA: Insufficient documentation

## 2017-06-13 DIAGNOSIS — Z79899 Other long term (current) drug therapy: Secondary | ICD-10-CM | POA: Insufficient documentation

## 2017-06-13 DIAGNOSIS — R21 Rash and other nonspecific skin eruption: Secondary | ICD-10-CM | POA: Diagnosis present

## 2017-06-13 LAB — URINALYSIS, COMPLETE (UACMP) WITH MICROSCOPIC
BILIRUBIN URINE: NEGATIVE
Bacteria, UA: NONE SEEN
GLUCOSE, UA: NEGATIVE mg/dL
Ketones, ur: NEGATIVE mg/dL
LEUKOCYTES UA: NEGATIVE
Nitrite: NEGATIVE
Protein, ur: NEGATIVE mg/dL
SPECIFIC GRAVITY, URINE: 1.018 (ref 1.005–1.030)
pH: 5 (ref 5.0–8.0)

## 2017-06-13 LAB — COMPREHENSIVE METABOLIC PANEL
ALBUMIN: 4.6 g/dL (ref 3.5–5.0)
ALK PHOS: 66 U/L (ref 38–126)
ALT: 15 U/L — ABNORMAL LOW (ref 17–63)
ANION GAP: 8 (ref 5–15)
AST: 20 U/L (ref 15–41)
BILIRUBIN TOTAL: 0.8 mg/dL (ref 0.3–1.2)
BUN: 13 mg/dL (ref 6–20)
CALCIUM: 9.4 mg/dL (ref 8.9–10.3)
CO2: 30 mmol/L (ref 22–32)
Chloride: 101 mmol/L (ref 101–111)
Creatinine, Ser: 1 mg/dL (ref 0.61–1.24)
GFR calc Af Amer: >60 mL/min (ref >60)
GLUCOSE: 94 mg/dL (ref 65–99)
POTASSIUM: 4.2 mmol/L (ref 3.5–5.1)
Sodium: 139 mmol/L (ref 135–145)
Total Protein: 7.3 g/dL (ref 6.5–8.1)

## 2017-06-13 LAB — CBC
HCT: 49.2 % (ref 40.0–52.0)
HEMOGLOBIN: 17 g/dL (ref 13.0–18.0)
MCH: 27.9 pg (ref 26.0–34.0)
MCHC: 34.6 g/dL (ref 32.0–36.0)
MCV: 80.7 fL (ref 80.0–100.0)
Platelets: 161 10*3/uL (ref 150–440)
RBC: 6.09 MIL/uL — ABNORMAL HIGH (ref 4.40–5.90)
RDW: 13.9 % (ref 11.5–14.5)
WBC: 9 10*3/uL (ref 3.8–10.6)

## 2017-06-13 LAB — LIPASE, BLOOD: Lipase: 24 U/L (ref 11–51)

## 2017-06-13 NOTE — ED Triage Notes (Signed)
Patient states that he had a tick bit in March. Patient states that since then he has had generalized body aches, nausea/vomiting, lost 50 lbs and rash under his left arm.

## 2017-06-14 DIAGNOSIS — B372 Candidiasis of skin and nail: Secondary | ICD-10-CM | POA: Diagnosis not present

## 2017-06-14 LAB — SEDIMENTATION RATE: SED RATE: 1 mm/h (ref 0–15)

## 2017-06-14 MED ORDER — ONDANSETRON 4 MG PO TBDP: 4.0000 mg | ORAL_TABLET | Freq: Three times a day (TID) | ORAL | 0 refills | Status: AC | PRN

## 2017-06-14 MED ORDER — NYSTATIN 100000 UNIT/GM EX CREA
TOPICAL_CREAM | Freq: Once | CUTANEOUS | Status: AC
  Administered 2017-06-14: 02:00:00 via TOPICAL
  Filled 2017-06-14: qty 15

## 2017-06-14 MED ORDER — KETOROLAC TROMETHAMINE 60 MG/2ML IM SOLN
60.0000 mg | Freq: Once | INTRAMUSCULAR | Status: AC
  Administered 2017-06-14: 60 mg via INTRAMUSCULAR
  Filled 2017-06-14: qty 2

## 2017-06-14 MED ORDER — ONDANSETRON 4 MG PO TBDP
4.0000 mg | ORAL_TABLET | Freq: Once | ORAL | Status: AC
  Administered 2017-06-14: 4 mg via ORAL
  Filled 2017-06-14: qty 1

## 2017-06-14 NOTE — ED Notes (Signed)
Patient states he has lost 50lb in 4 months. Asked patient if he has seen anyone about his weight loss and he stated, "no".

## 2017-06-14 NOTE — ED Notes (Signed)
Found patient walking in hall trying to leave. Escorted patient back to his room to get him to sign discharge and go over papers. Patient not want repeat vitals. Patient ambulatory out. NAD.

## 2017-06-14 NOTE — Discharge Instructions (Signed)
PLease follow up with the acute care clinic or your PCP for further evaluation of your medical condition.

## 2017-06-14 NOTE — ED Provider Notes (Signed)
[facility] Emergency Department Provider Note   ____________________________________________   First MD Initiated Contact with Patient 06/14/17 0018     (approximate)  I have reviewed the triage vital signs and the nursing notes.   HISTORY  Chief Complaint Rash; Emesis; and Joint Pain    HPI Rodney Velasquez is a 46 y.o. male who comes into the hospital today stating that he's been sick since April.The patient reports that he was bitten by a tick under his right arm. He reports that the tick didn't look right and after he removed it he started having many symptoms. He reports that his entire body aches and he's had fatigue. He states that he's lost 50 pounds since then. He also reports that he had a rash all over his body that started under his armpit. Today he states it is better but is mainly between his legs. He reports that his joints hurt and he is unable to tolerate it. The patient has been taking ibuprofen and he even tried a dose of Percocet but has not helped with the pains. He's been taking Benadryl for the rash because he reports that it itches and burns. Several people have told him that he could have Lyme disease. He states that he's had some nausea on and off today and when he got home had an episode of feeling clammy and he vomited. He reports that today was a bad day. He also had an episode where he felt like he may have been confused. He fell asleep on the floor in the bathroom and woke up with some sweats. He again is concerned about tick fever or Lyme. He never took his temperature but felt he may have had a fever. The patient is here today for evaluation. When asked if he followed up with his doctor the patient states that his doctor won't take him because there is something wrong with his medicare.   Past Medical History:  Diagnosis Date  . Asthma   . Brain tumor ([place])   . COPD (chronic obstructive pulmonary disease) ([place])   . Enlarged heart     . Kidney stones   . Migraine   . Poor circulation   . Renal stone 12/14/12  . Ruptured lumbar disc   . Seizures ([place])     There are no active problems to display for this patient.   Past Surgical History:  Procedure Laterality Date  . CARDIAC CATHETERIZATION    . KNEE SURGERY      Prior to Admission medications   Medication Sig Start Date End Date Taking? Authorizing Provider  amoxicillin-clavulanate (AUGMENTIN) 875-125 MG tablet Take 1 tablet by mouth 2 (two) times daily. Patient not taking: Reported on 08/04/2015 07/17/15   Cuthriell, Charline Bills, PA-C  amphetamine-dextroamphetamine (ADDERALL) 20 MG tablet TAKE 1 TO 1 AND 1/2 TABLETS BY MOUTH EVERY AFTERNOON AS DIRECTED 06/03/15   [provider]  amphetamine-dextroamphetamine (ADDERALL) 30 MG tablet Take 1 tablet by mouth 2 (two) times daily. 07/01/15   [provider]  azelastine (ASTELIN) 0.1 % nasal spray 2SPR NASAL TWO TIMES A DAY 07/07/15   [provider]  BREO ELLIPTA 100-25 MCG/INH AEPB Take 1 puff by mouth daily.  07/22/15   [provider]  buPROPion (WELLBUTRIN XL) 150 MG 24 hr tablet Take 150 mg by mouth daily.    [provider]  clonazePAM (KLONOPIN) 1 MG tablet Take 1 tablet by mouth 3 (three) times daily. 07/02/15   [provider]  DULoxetine (CYMBALTA) 60 MG capsule Take 60 mg by mouth daily. 06/16/15   [provider]  fluticasone Asencion Islam) 50 MCG/ACT nasal spray  07/22/15   [provider]  gabapentin (NEURONTIN) 300 MG capsule Take 600 mg by mouth 3 (three) times daily. 07/07/15   [provider]  levothyroxine (SYNTHROID, LEVOTHROID) 50 MCG tablet Take 50 mcg by mouth daily. 07/07/15   [provider]  meloxicam (MOBIC) 15 MG tablet Take 1 tablet (15 mg total) by mouth daily. Patient not taking: Reported on 08/04/2015 07/17/15   Cuthriell, Charline Bills, PA-C  omeprazole (PRILOSEC) 20 MG capsule Take 1 capsule by mouth 2 (two) times  daily. 07/20/15   [provider]  ondansetron (ZOFRAN ODT) 4 MG disintegrating tablet Take 1 tablet (4 mg total) by mouth every 8 (eight) hours as needed for nausea or vomiting. 06/14/17   Loney Hering, MD  oxyCODONE-acetaminophen (ROXICET) 5-325 MG tablet Take 1 tablet by mouth every 6 (six) hours as needed. 09/07/16   Schuyler Amor, MD  topiramate (TOPAMAX) 25 MG tablet Take 25 mg by mouth 3 (three) times daily. 07/22/15   [provider]  triamterene-hydrochlorothiazide (MAXZIDE) 75-50 MG tablet Take 1 tablet by mouth daily. 07/07/15   [provider]  VASCEPA 1 G CAPS Take 2 capsules by mouth 2 (two) times daily. 05/17/15   [provider]  venlafaxine XR (EFFEXOR-XR) 150 MG 24 hr capsule Take 150 mg by mouth daily. 06/18/15   [provider]  VENTOLIN HFA 108 (90 BASE) MCG/ACT inhaler Take 2 puffs by mouth every 4 (four) hours. 07/22/15   [provider]    Allergies Patient has no known allergies.  No family history on file.  Social History Social History  Substance Use Topics  . Smoking status: Former Smoker    Packs/day: 2.50    Types: Cigarettes  . Smokeless tobacco: Never Used  . Alcohol use Yes     Comment: occasional    Review of Systems  Constitutional: fatigue Eyes: No visual changes. ENT: No sore throat. Cardiovascular: Denies chest pain. Respiratory: Denies shortness of breath. Gastrointestinal: Nausea and vomiting No abdominal pain. No diarrhea.  No constipation. Genitourinary: Negative for dysuria. Musculoskeletal: body aches and joint pain Skin: Negative for rash. Neurological: Negative for headaches, focal weakness or numbness.   ____________________________________________   PHYSICAL EXAM:  VITAL SIGNS: ED Triage Vitals  Enc Vitals Group     BP 06/13/17 2239 (!) 133/91     Pulse Rate 06/13/17 2239 74     Resp 06/13/17 2239 18     Temp 06/13/17 2243 97.8 F (36.6 C)     Temp Source 06/13/17  2243 Oral     SpO2 06/13/17 2239 100 %     Weight 06/13/17 2240 210 lb (95.3 kg)     Height 06/13/17 2240 5' 6"  (1.676 m)     Head Circumference --      Peak Flow --      Pain Score 06/13/17 2239 6     Pain Loc --      Pain Edu? --      Excl. in [place]? --     Constitutional: Alert and oriented. Well appearing and in mild distress. Eyes: Conjunctivae are normal. PERRL. EOMI. Head: Atraumatic. Nose: No congestion/rhinnorhea. Mouth/Throat: Mucous membranes are moist.  Oropharynx non-erythematous. Cardiovascular: Normal rate, regular rhythm. Grossly normal heart sounds.  Good peripheral circulation. Respiratory: Normal respiratory effort.  No retractions. Lungs CTAB.  Gastrointestinal: Soft and nontender. No distention. Positive bowel sounds Musculoskeletal: No lower extremity tenderness nor edema.   Neurologic:  Normal speech and language.  Skin:  Skin is warm, dry and intact. Scaly patches with raised edges to the patient's bilateral inner thighs there is some areas of erythema as well nontender to palpation. Excoriations noted to the rash. Psychiatric: Mood and affect are normal.   ____________________________________________   LABS (all labs ordered are listed, but only abnormal results are displayed)  Labs Reviewed  COMPREHENSIVE METABOLIC PANEL - Abnormal; Notable for the following:       Result Value   ALT 15 (*)    All other components within normal limits  CBC - Abnormal; Notable for the following:    RBC 6.09 (*)    All other components within normal limits  URINALYSIS, COMPLETE (UACMP) WITH MICROSCOPIC - Abnormal; Notable for the following:    Color, Urine YELLOW (*)    APPearance CLEAR (*)    Hgb urine dipstick MODERATE (*)    Squamous Epithelial / LPF 0-5 (*)    All other components within normal limits  LIPASE, BLOOD  SEDIMENTATION RATE  LYME DISEASE, WESTERN BLOT    ____________________________________________  EKG  none ____________________________________________  RADIOLOGY  No results found.  ____________________________________________   PROCEDURES  Procedure(s) performed: None  Procedures  Critical Care performed: No  ____________________________________________   INITIAL IMPRESSION / ASSESSMENT AND PLAN / ED COURSE  Pertinent labs & imaging results that were available during my care of the patient were reviewed by me and considered in my medical decision making (see chart for details).  This is a 46 year old male who comes into the hospital today with multiple symptoms. The patient states that he was bitten by a tick multiple months ago and he's been having symptoms since then. I did review the patient's chart and it did appear that his physician wanted him evaluated for lung cancer. When I asked the patient about this evaluation he states that he did not go. He reports that if he has lung cancer he might just have to let it taken out. He reports that he does not want to be evaluated or treated for lung cancer. He reports that his main concern here is the rash on his legs and being treated for Lyme disease. I did evaluate the patient's blood work and it was unremarkable. I sent an ESR and I also sent a Lyme screen. I feel that the patient's rash in his thighs has the appearance of candidal dermatitis. It has some scaly patches as well. I feel that the patient may have a rheumatologic process or a possible malignancy but he again reiterates that he does not like doctors and he does not trust doctors. He reports that his biggest concern is Lyme disease. As our test is a send out I informed him that it will be sent and they will call with an abnormal results. I did give the patient a shot of Toradol and some nystatin cream to his thighs. He will be discharged and strongly encouraged to follow-up with his primary care physician. The patient  has no other complaints at this time.      ____________________________________________   FINAL CLINICAL IMPRESSION(S) / ED DIAGNOSES  Final diagnoses:  Yeast dermatitis  Myalgia  Arthralgia, unspecified joint  Non-intractable vomiting with nausea, unspecified vomiting type      NEW MEDICATIONS STARTED DURING THIS VISIT:  Discharge Medication List as of 06/14/2017  2:27 AM  START taking these medications   Details  ondansetron (ZOFRAN ODT) 4 MG disintegrating tablet Take 1 tablet (4 mg total) by mouth every 8 (eight) hours as needed for nausea or vomiting., Starting Thu 06/14/2017, Print         Note:  This document was prepared using Dragon voice recognition software and may include unintentional dictation errors.    Loney Hering, MD 06/14/17 [phone]

## 2017-06-15 LAB — LYME DISEASE, WESTERN BLOT
IGG P23 AB.: ABSENT
IGG P41 AB.: ABSENT
IGG P58 AB.: ABSENT
IGG P66 AB.: ABSENT
IGM P41 AB.: ABSENT
IgG P18 Ab.: ABSENT
IgG P28 Ab.: ABSENT
IgG P30 Ab.: ABSENT
IgG P39 Ab.: ABSENT
IgG P45 Ab.: ABSENT
IgG P93 Ab.: ABSENT
IgM P39 Ab.: ABSENT
LYME IGM WB: NEGATIVE
Lyme IgG Wb: NEGATIVE

## 2017-11-23 ENCOUNTER — Other Ambulatory Visit: Payer: Self-pay

## 2017-11-23 ENCOUNTER — Emergency Department
Admission: EM | Admit: 2017-11-23 | Discharge: 2017-11-24 | Payer: Medicare Other | Attending: Emergency Medicine | Admitting: Emergency Medicine

## 2017-11-23 ENCOUNTER — Emergency Department: Payer: Medicare Other

## 2017-11-23 ENCOUNTER — Encounter: Payer: Self-pay | Admitting: Emergency Medicine

## 2017-11-23 DIAGNOSIS — M48061 Spinal stenosis, lumbar region without neurogenic claudication: Secondary | ICD-10-CM | POA: Insufficient documentation

## 2017-11-23 DIAGNOSIS — M5410 Radiculopathy, site unspecified: Secondary | ICD-10-CM

## 2017-11-23 DIAGNOSIS — M47816 Spondylosis without myelopathy or radiculopathy, lumbar region: Secondary | ICD-10-CM | POA: Insufficient documentation

## 2017-11-23 DIAGNOSIS — M5442 Lumbago with sciatica, left side: Secondary | ICD-10-CM

## 2017-11-23 DIAGNOSIS — R1110 Vomiting, unspecified: Secondary | ICD-10-CM | POA: Diagnosis not present

## 2017-11-23 DIAGNOSIS — N50812 Left testicular pain: Secondary | ICD-10-CM | POA: Diagnosis not present

## 2017-11-23 DIAGNOSIS — F1210 Cannabis abuse, uncomplicated: Secondary | ICD-10-CM | POA: Diagnosis not present

## 2017-11-23 DIAGNOSIS — Z79899 Other long term (current) drug therapy: Secondary | ICD-10-CM | POA: Insufficient documentation

## 2017-11-23 DIAGNOSIS — Z87891 Personal history of nicotine dependence: Secondary | ICD-10-CM | POA: Diagnosis not present

## 2017-11-23 DIAGNOSIS — J45909 Unspecified asthma, uncomplicated: Secondary | ICD-10-CM | POA: Diagnosis not present

## 2017-11-23 DIAGNOSIS — J449 Chronic obstructive pulmonary disease, unspecified: Secondary | ICD-10-CM | POA: Diagnosis not present

## 2017-11-23 DIAGNOSIS — M542 Cervicalgia: Secondary | ICD-10-CM | POA: Diagnosis present

## 2017-11-23 DIAGNOSIS — I861 Scrotal varices: Secondary | ICD-10-CM | POA: Insufficient documentation

## 2017-11-23 LAB — COMPREHENSIVE METABOLIC PANEL
ALK PHOS: 82 U/L (ref 38–126)
ALT: 20 U/L (ref 17–63)
AST: 25 U/L (ref 15–41)
Albumin: 4.5 g/dL (ref 3.5–5.0)
Anion gap: 11 (ref 5–15)
BILIRUBIN TOTAL: 0.8 mg/dL (ref 0.3–1.2)
BUN: 18 mg/dL (ref 6–20)
CALCIUM: 9.5 mg/dL (ref 8.9–10.3)
CO2: 23 mmol/L (ref 22–32)
CREATININE: 0.9 mg/dL (ref 0.61–1.24)
Chloride: 106 mmol/L (ref 101–111)
GFR calc Af Amer: >60 mL/min (ref >60)
Glucose, Bld: 110 mg/dL — ABNORMAL HIGH (ref 65–99)
Potassium: 4.2 mmol/L (ref 3.5–5.1)
Sodium: 140 mmol/L (ref 135–145)
Total Protein: 7.6 g/dL (ref 6.5–8.1)

## 2017-11-23 LAB — CBC WITH DIFFERENTIAL/PLATELET
Basophils Absolute: 0 10*3/uL (ref 0–0.1)
Basophils Relative: 0 %
Eosinophils Absolute: 0.2 10*3/uL (ref 0–0.7)
Eosinophils Relative: 2 %
HEMATOCRIT: 46.8 % (ref 40.0–52.0)
HEMOGLOBIN: 15.5 g/dL (ref 13.0–18.0)
LYMPHS ABS: 1.6 10*3/uL (ref 1.0–3.6)
LYMPHS PCT: 13 %
MCH: 27 pg (ref 26.0–34.0)
MCHC: 33.2 g/dL (ref 32.0–36.0)
MCV: 81.4 fL (ref 80.0–100.0)
Monocytes Absolute: 0.6 10*3/uL (ref 0.2–1.0)
Monocytes Relative: 5 %
Neutro Abs: 9.2 10*3/uL — ABNORMAL HIGH (ref 1.4–6.5)
Neutrophils Relative %: 80 %
Platelets: 187 10*3/uL (ref 150–440)
RBC: 5.75 MIL/uL (ref 4.40–5.90)
RDW: 13.9 % (ref 11.5–14.5)
WBC: 11.6 10*3/uL — AB (ref 3.8–10.6)

## 2017-11-23 MED ORDER — FENTANYL CITRATE (PF) 100 MCG/2ML IJ SOLN
50.0000 ug | Freq: Once | INTRAMUSCULAR | Status: AC
  Administered 2017-11-23: 23:00:00 via INTRAVENOUS
  Filled 2017-11-23: qty 2

## 2017-11-23 MED ORDER — ONDANSETRON HCL 4 MG/2ML IJ SOLN
4.0000 mg | Freq: Once | INTRAMUSCULAR | Status: AC
  Administered 2017-11-23: 4 mg via INTRAVENOUS
  Filled 2017-11-23: qty 2
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
## 2017-11-24 ENCOUNTER — Emergency Department: Payer: Medicare Other

## 2017-11-24 DIAGNOSIS — I861 Scrotal varices: Secondary | ICD-10-CM | POA: Diagnosis not present

## 2017-11-24 MED ORDER — DIAZEPAM 5 MG PO TABS
5.0000 mg | ORAL_TABLET | Freq: Once | ORAL | Status: AC
  Administered 2017-11-24: 5 mg via ORAL
  Filled 2017-11-24: qty 1

## 2017-11-24 MED ORDER — FENTANYL CITRATE (PF) 100 MCG/2ML IJ SOLN
50.0000 ug | Freq: Once | INTRAMUSCULAR | Status: AC
  Administered 2017-11-24: 50 ug via INTRAVENOUS
  Filled 2017-11-24: qty 2

## 2017-11-24 MED ORDER — LIDOCAINE 5 % EX PTCH: 1.0000 | MEDICATED_PATCH | CUTANEOUS | 0 refills | Status: AC

## 2017-11-24 MED ORDER — OXYCODONE-ACETAMINOPHEN 5-325 MG PO TABS
1.0000 | ORAL_TABLET | Freq: Once | ORAL | Status: AC
  Administered 2017-11-24: 1 via ORAL
  Filled 2017-11-24: qty 1

## 2017-11-24 MED ORDER — IBUPROFEN 800 MG PO TABS: 800.0000 mg | ORAL_TABLET | Freq: Three times a day (TID) | ORAL | 0 refills | Status: AC | PRN

## 2017-11-24 MED ORDER — DIAZEPAM 5 MG PO TABS: 5.0000 mg | ORAL_TABLET | Freq: Three times a day (TID) | ORAL | 0 refills | Status: AC | PRN

## 2017-11-24 MED ORDER — METHYLPREDNISOLONE 4 MG PO TBPK: ORAL_TABLET | ORAL | 0 refills | Status: AC

## 2017-11-24 MED ORDER — KETOROLAC TROMETHAMINE 30 MG/ML IJ SOLN
10.0000 mg | Freq: Once | INTRAMUSCULAR | Status: AC
  Administered 2017-11-24: 9.9 mg via INTRAVENOUS
  Filled 2017-11-24: qty 1

## 2017-11-24 NOTE — ED Notes (Signed)
Pt continues to moan and yell out for pain meds. New orders received.

## 2017-11-24 NOTE — ED Notes (Signed)
Pt to [place] but yelling out intermittently.

## 2017-11-24 NOTE — Discharge Instructions (Signed)
1.  Continue your pain medicines as prescribed by your pain clinic doctor. 2.  You may take Motrin and use Lidoderm patches in addition to your pain medicine for pain relief. 3.  You may take Valium as needed for muscle spasms. 4.  Apply moist heat to affected area several times daily. 5.  Take steroid taper as prescribed (Medrol Dosepak). 6.  Return to the ER for worsening symptoms, losing control of your bowel or bladder, leg weakness, or other concerns.

## 2017-11-24 NOTE — ED Notes (Signed)
Pt given a urinal per request

## 2017-11-24 NOTE — ED Notes (Signed)
Patient given water per request.

## 2018-02-28 ENCOUNTER — Emergency Department: Payer: Medicare Other

## 2018-02-28 ENCOUNTER — Emergency Department
Admission: EM | Admit: 2018-02-28 | Discharge: 2018-02-28 | Disposition: A | Discharge status: Home / Self Care | Payer: Medicare Other | Attending: Emergency Medicine | Admitting: Emergency Medicine

## 2018-02-28 DIAGNOSIS — R079 Chest pain, unspecified: Secondary | ICD-10-CM | POA: Insufficient documentation

## 2018-02-28 DIAGNOSIS — Z5321 Procedure and treatment not carried out due to patient leaving prior to being seen by health care provider: Secondary | ICD-10-CM | POA: Diagnosis not present

## 2018-02-28 LAB — CBC
HEMATOCRIT: 49.7 % (ref 40.0–52.0)
Hemoglobin: 16.8 g/dL (ref 13.0–18.0)
MCH: 27.9 pg (ref 26.0–34.0)
MCHC: 33.8 g/dL (ref 32.0–36.0)
MCV: 82.4 fL (ref 80.0–100.0)
Platelets: 170 10*3/uL (ref 150–440)
RBC: 6.03 MIL/uL — ABNORMAL HIGH (ref 4.40–5.90)
RDW: 13.8 % (ref 11.5–14.5)
WBC: 12.1 10*3/uL — ABNORMAL HIGH (ref 3.8–10.6)

## 2018-02-28 LAB — BASIC METABOLIC PANEL
Anion gap: 9 (ref 5–15)
BUN: 24 mg/dL — AB (ref 6–20)
CHLORIDE: 104 mmol/L (ref 101–111)
CO2: 23 mmol/L (ref 22–32)
Calcium: 9.6 mg/dL (ref 8.9–10.3)
Creatinine, Ser: 0.78 mg/dL (ref 0.61–1.24)
GFR calc Af Amer: >60 mL/min (ref >60)
GFR calc non Af Amer: >60 mL/min (ref >60)
GLUCOSE: 106 mg/dL — AB (ref 65–99)
POTASSIUM: 3.9 mmol/L (ref 3.5–5.1)
Sodium: 136 mmol/L (ref 135–145)

## 2018-02-28 LAB — TROPONIN I: Troponin I: <0.03 ng/mL (ref <0.03)

## 2018-02-28 NOTE — ED Notes (Signed)
Patient up to stat desk and reported that he would like to leave. This RN discussed the dangers of leaving without further treatment or assessment. Patient verbalized understanding and complained about wait time. RN discussed wait times and the current testing and assessment the patient had received thus far. Patient verbalized understanding and reported he was going to leave and go to [facility]. Patient left.

## 2018-02-28 NOTE — ED Triage Notes (Signed)
Patient c/o medial chest pain radiating to back. Patient reports associated symptoms of SOB, dizziness, lightheadedness. Symptoms began yesterday. Patient reports hx of aortic aneurysm and heart murmur.

## 2018-06-04 ENCOUNTER — Emergency Department
Admission: EM | Admit: 2018-06-04 | Discharge: 2018-06-04 | Discharge status: Against Medical Advice | Payer: Medicare Other | Attending: Student in an Organized Health Care Education/Training Program | Admitting: Student in an Organized Health Care Education/Training Program

## 2018-06-04 ENCOUNTER — Other Ambulatory Visit: Payer: Self-pay

## 2018-06-04 ENCOUNTER — Emergency Department: Payer: Medicare Other

## 2018-06-04 ENCOUNTER — Encounter: Payer: Self-pay | Admitting: Emergency Medicine

## 2018-06-04 DIAGNOSIS — Z5320 Procedure and treatment not carried out because of patient's decision for unspecified reasons: Secondary | ICD-10-CM | POA: Insufficient documentation

## 2018-06-04 DIAGNOSIS — R079 Chest pain, unspecified: Secondary | ICD-10-CM | POA: Diagnosis present

## 2018-06-04 DIAGNOSIS — R0789 Other chest pain: Secondary | ICD-10-CM | POA: Insufficient documentation

## 2018-06-04 DIAGNOSIS — F1210 Cannabis abuse, uncomplicated: Secondary | ICD-10-CM | POA: Insufficient documentation

## 2018-06-04 DIAGNOSIS — R55 Syncope and collapse: Secondary | ICD-10-CM | POA: Diagnosis not present

## 2018-06-04 DIAGNOSIS — Z87891 Personal history of nicotine dependence: Secondary | ICD-10-CM | POA: Diagnosis not present

## 2018-06-04 DIAGNOSIS — J449 Chronic obstructive pulmonary disease, unspecified: Secondary | ICD-10-CM | POA: Diagnosis not present

## 2018-06-04 DIAGNOSIS — Z79899 Other long term (current) drug therapy: Secondary | ICD-10-CM | POA: Insufficient documentation

## 2018-06-04 LAB — BASIC METABOLIC PANEL
ANION GAP: 6 (ref 5–15)
BUN: 13 mg/dL (ref 6–20)
CALCIUM: 9.5 mg/dL (ref 8.9–10.3)
CO2: 28 mmol/L (ref 22–32)
Chloride: 109 mmol/L (ref 98–111)
Creatinine, Ser: 1.04 mg/dL (ref 0.61–1.24)
Glucose, Bld: 136 mg/dL — ABNORMAL HIGH (ref 70–99)
Potassium: 3.6 mmol/L (ref 3.5–5.1)
Sodium: 143 mmol/L (ref 135–145)

## 2018-06-04 LAB — CBC
HCT: 46.3 % (ref 40.0–52.0)
HEMOGLOBIN: 16.2 g/dL (ref 13.0–18.0)
MCH: 28.9 pg (ref 26.0–34.0)
MCHC: 34.9 g/dL (ref 32.0–36.0)
MCV: 82.6 fL (ref 80.0–100.0)
Platelets: 158 10*3/uL (ref 150–440)
RBC: 5.6 MIL/uL (ref 4.40–5.90)
RDW: 14.2 % (ref 11.5–14.5)
WBC: 9.1 10*3/uL (ref 3.8–10.6)

## 2018-06-04 LAB — TROPONIN I

## 2018-06-04 MED ORDER — LORAZEPAM 1 MG PO TABS
1.0000 mg | ORAL_TABLET | Freq: Once | ORAL | Status: AC
  Administered 2018-06-04: 1 mg via ORAL
  Filled 2018-06-04: qty 1

## 2018-06-04 NOTE — ED Notes (Signed)
Patient transported to X-ray 
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
## 2018-06-04 NOTE — ED Provider Notes (Addendum)
[facility] Emergency Department Provider Note    First MD Initiated Contact with Patient 06/04/18 1902     (approximate)  I have reviewed the triage vital signs and the nursing notes.   HISTORY  Chief Complaint Chest Pain and Near Syncope    HPI Rodney Velasquez is a 47 y.o. male extensive past medical history history of seizure disorder presents to the ER with chief complaint of chest pain and near syncopal episode.  Patient states he has been very stressed out recently is worried about losing his job and becoming homeless again.  Very anxious and tearful.  Denies any SI or HI.  States he still having chest pressure and pain and this is been going on for over a week.  States that prior to the episode today he did have pain shooting up into his neck and through to his back.  States he did fall and felt like his vision went "blank" he fell hit his head on the car and then again on the ground.  Was witnessed by neighbors that he is tried to stand up but was able to after.  States he does have a history of tingling to his legs history of spinal injury.   Denies any fevers or cough.  Denies any SI or HI   Past Medical History:  Diagnosis Date  . Asthma   . Brain tumor ([place])   . COPD (chronic obstructive pulmonary disease) ([place])   . Enlarged heart   . Kidney stones   . Migraine   . Poor circulation   . Renal stone 12/14/12  . Ruptured lumbar disc   . Seizures ([place])    No family history on file. Past Surgical History:  Procedure Laterality Date  . CARDIAC CATHETERIZATION    . KNEE SURGERY     There are no active problems to display for this patient.     Prior to Admission medications   Medication Sig Start Date End Date Taking? Authorizing Provider  amoxicillin-clavulanate (AUGMENTIN) 875-125 MG tablet Take 1 tablet by mouth 2 (two) times daily. Patient not taking: Reported on 08/04/2015 07/17/15   Cuthriell, Charline Bills, PA-C    amphetamine-dextroamphetamine (ADDERALL) 20 MG tablet TAKE 1 TO 1 AND 1/2 TABLETS BY MOUTH EVERY AFTERNOON AS DIRECTED 06/03/15   [provider]  amphetamine-dextroamphetamine (ADDERALL) 30 MG tablet Take 1 tablet by mouth 2 (two) times daily. 07/01/15   [provider]  azelastine (ASTELIN) 0.1 % nasal spray 2SPR NASAL TWO TIMES A DAY 07/07/15   [provider]  BREO ELLIPTA 100-25 MCG/INH AEPB Take 1 puff by mouth daily.  07/22/15   [provider]  buPROPion (WELLBUTRIN XL) 150 MG 24 hr tablet Take 150 mg by mouth daily.    [provider]  clonazePAM (KLONOPIN) 1 MG tablet Take 1 tablet by mouth 3 (three) times daily. 07/02/15   [provider]  diazepam (VALIUM) 5 MG tablet Take 1 tablet (5 mg total) by mouth every 8 (eight) hours as needed for anxiety. 11/24/17   Paulette Blanch, MD  DULoxetine (CYMBALTA) 60 MG capsule Take 60 mg by mouth daily. 06/16/15   [provider]  fluticasone Asencion Islam) 50 MCG/ACT nasal spray  07/22/15   [provider]  gabapentin (NEURONTIN) 300 MG capsule Take 600 mg by mouth 3 (three) times daily. 07/07/15   [provider]  ibuprofen (ADVIL,MOTRIN) 800 MG tablet Take 1 tablet (800 mg total) by mouth every 8 (eight)  hours as needed for moderate pain. 11/24/17   Paulette Blanch, MD  levothyroxine (SYNTHROID, LEVOTHROID) 50 MCG tablet Take 50 mcg by mouth daily. 07/07/15   [provider]  lidocaine (LIDODERM) 5 % Place 1 patch onto the skin daily. Remove & Discard patch within 12 hours or as directed by MD 11/24/17   Paulette Blanch, MD  meloxicam (MOBIC) 15 MG tablet Take 1 tablet (15 mg total) by mouth daily. Patient not taking: Reported on 08/04/2015 07/17/15   Cuthriell, Charline Bills, PA-C  methylPREDNISolone (MEDROL DOSEPAK) 4 MG TBPK tablet Take as directed 11/24/17   Paulette Blanch, MD  omeprazole (PRILOSEC) 20 MG capsule Take 1 capsule by mouth 2 (two) times daily. 07/20/15   [provider]  ondansetron (ZOFRAN ODT) 4 MG disintegrating tablet Take 1 tablet (4 mg total) by mouth every 8 (eight) hours as needed for nausea or vomiting. 06/14/17   Loney Hering, MD  oxyCODONE-acetaminophen (ROXICET) 5-325 MG tablet Take 1 tablet by mouth every 6 (six) hours as needed. 09/07/16   Schuyler Amor, MD  topiramate (TOPAMAX) 25 MG tablet Take 25 mg by mouth 3 (three) times daily. 07/22/15   [provider]  triamterene-hydrochlorothiazide (MAXZIDE) 75-50 MG tablet Take 1 tablet by mouth daily. 07/07/15   [provider]  VASCEPA 1 G CAPS Take 2 capsules by mouth 2 (two) times daily. 05/17/15   [provider]  venlafaxine XR (EFFEXOR-XR) 150 MG 24 hr capsule Take 150 mg by mouth daily. 06/18/15   [provider]  VENTOLIN HFA 108 (90 BASE) MCG/ACT inhaler Take 2 puffs by mouth every 4 (four) hours. 07/22/15   [provider]    Allergies Patient has no known allergies.    Social History Social History   Tobacco Use  . Smoking status: Former Smoker    Packs/day: 2.50    Types: Cigarettes  . Smokeless tobacco: Never Used  Substance Use Topics  . Alcohol use: Yes    Comment: occasional  . Drug use: Yes    Types: Marijuana    Review of Systems Patient denies headaches, rhinorrhea, blurry vision, numbness, shortness of breath, chest pain, edema, cough, abdominal pain, nausea, vomiting, diarrhea, dysuria, fevers, rashes or hallucinations unless otherwise stated above in HPI. ____________________________________________   PHYSICAL EXAM:  VITAL SIGNS: Vitals:   06/04/18 1828 06/04/18 1900  BP: (!) 140/96 (!) 147/85  Pulse: 85 84  Resp: 16 (!) 21  Temp: 98.6 F (37 C)   SpO2: 96% 97%    Constitutional: Alert and oriented. tearful Eyes: Conjunctivae are normal.  Head: Atraumatic. Nose: No congestion/rhinnorhea. Mouth/Throat: Mucous membranes are moist.   Neck: No stridor. Painless ROM.  Cardiovascular: Normal rate, regular  rhythm. Grossly normal heart sounds.  Good peripheral circulation. Respiratory: Normal respiratory effort.  No retractions. Lungs CTAB. Gastrointestinal: Soft and nontender. No distention. No abdominal bruits. No CVA tenderness. Genitourinary:  Musculoskeletal: No lower extremity tenderness nor edema.  No joint effusions. Neurologic:  Normal speech and language. No gross focal neurologic deficits are appreciated. No facial droop Skin:  Skin is warm, dry and intact. No rash noted. Psychiatric:tearful but organized thought process, denies SI or HI  ____________________________________________   LABS (all labs ordered are listed, but only abnormal results are displayed)  Results for orders placed or performed during the hospital encounter of 06/04/18 (from the past 24 hour(s))  Basic metabolic panel     Status: Abnormal   Collection Time: 06/04/18  6:31  PM  Result Value Ref Range   Sodium 143 135 - 145 mmol/L   Potassium 3.6 3.5 - 5.1 mmol/L   Chloride 109 98 - 111 mmol/L   CO2 28 22 - 32 mmol/L   Glucose, Bld 136 (H) 70 - 99 mg/dL   BUN 13 6 - 20 mg/dL   Creatinine, Ser 1.04 0.61 - 1.24 mg/dL   Calcium 9.5 8.9 - 10.3 mg/dL   GFR calc non Af Amer >60 >60 mL/min   GFR calc Af Amer >60 >60 mL/min   Anion gap 6 5 - 15  CBC     Status: None   Collection Time: 06/04/18  6:31 PM  Result Value Ref Range   WBC 9.1 3.8 - 10.6 K/uL   RBC 5.60 4.40 - 5.90 MIL/uL   Hemoglobin 16.2 13.0 - 18.0 g/dL   HCT 46.3 40.0 - 52.0 %   MCV 82.6 80.0 - 100.0 fL   MCH 28.9 26.0 - 34.0 pg   MCHC 34.9 32.0 - 36.0 g/dL   RDW 14.2 11.5 - 14.5 %   Platelets 158 150 - 440 K/uL  Troponin I     Status: None   Collection Time: 06/04/18  6:31 PM  Result Value Ref Range   Troponin I <0.03 <0.03 ng/mL   ____________________________________________  EKG My review and personal interpretation at Time: 18:22   Indication: chest pain  Rate: 90  Rhythm: sinus Axis: normal Other: normal intervals, no  stemi ____________________________________________  RADIOLOGY  I personally reviewed all radiographic images ordered to evaluate for the above acute complaints and reviewed radiology reports and findings.  These findings were personally discussed with the patient.  Please see medical record for radiology report.  ____________________________________________   PROCEDURES  Procedure(s) performed:  Procedures    Critical Care performed: no ____________________________________________   INITIAL IMPRESSION / ASSESSMENT AND PLAN / ED COURSE  Pertinent labs & imaging results that were available during my care of the patient were reviewed by me and considered in my medical decision making (see chart for details).   DDX: ACS, pericarditis, esophagitis, boerhaaves, pe, dissection, pna, bronchitis, costochondritis   CONNELLY NETTERVILLE is a 47 y.o. who presents to the ED with symptoms as described above.  He is currently afebrile and hemodynamically stable.  Exam as above.  Initial EKG shows no evidence of acute ischemia.  Based on the patient's presentation CT imaging of the head neck and back will be ordered to evaluate for head injury, fracture, vascular abnormality.  Will be sent for the above differential.  The patient will be placed on continuous pulse oximetry and telemetry for monitoring.  Laboratory evaluation will be sent to evaluate for the above complaints.     Clinical Course as of Jun 04 2101  Tue Jun 04, 2018  2022 Patient was being taken to CT for CT angiogram.  IV infiltrated 1 brought back apparently eloped from the room.  Currently checking the ER.   [PR]    Clinical Course User Index [PR] Merlyn Lot, MD   ----------------------------------------- 8:47 PM on 06/04/2018 -----------------------------------------  Please have been contacted to help with surgery for the patient.  Is not currently present in the ER.  As part of my medical decision making, I reviewed  the following data within the [facility] notes reviewed and incorporated, Labs reviewed, notes from prior ED visits and [place] Controlled Substance Database   ____________________________________________   FINAL CLINICAL IMPRESSION(S) / ED DIAGNOSES  Final  diagnoses:  Syncope and collapse  Atypical chest pain      NEW MEDICATIONS STARTED DURING THIS VISIT:  New Prescriptions   No medications on file     Note:  This document was prepared using Dragon voice recognition software and may include unintentional dictation errors.    Merlyn Lot, MD 06/04/18 3419    Merlyn Lot, MD 06/04/18 2102

## 2018-06-04 NOTE — ED Notes (Signed)
Spoke with MD McShane about pt presentation, next bed if available. NO new orders given,. First RN aware of pt presentation

## 2018-06-04 NOTE — ED Notes (Signed)
Pt was brought back from CT scan by techs and when this nurse went to room the patient was gone. Pt was not found by any of the nurses in the department and not outside. The numbers that were given by patient in the chart were called and none were responded to. Police were given address of patient to go to pt home in order to pull out IVs that were in right hand and left AC. Charge notified.

## 2018-06-04 NOTE — ED Notes (Signed)
Police notified this nurse that police went to house and there was a truck in the driveway and dogs at the door. Police knocked on the door and there were lights on in the house but no one came to the door. Police notified this nurse that they could not tell if no one was home or if no one wanted to come to the door. Charge notified and notified nurse to take off the floor.

## 2018-06-25 IMAGING — US US SCROTUM
1 series · 13 of 25 positions shown · non-contrast
Comparison: CT abdomen and pelvis 09/07/2016. Scrotal ultrasound
01/03/2014

CLINICAL DATA: Right testicular pain for 3 days. Right flank pain.
CT abdomen and pelvis indicated a right hydrocele.

EXAM:
ULTRASOUND OF SCROTUM
TECHNIQUE: Complete ultrasound examination of the testicles, epididymis, and
other scrotal structures was performed.

[Series 1: us scrotum · 0.08mm/px · 13 of 61 slices shown]
[im 1/61]
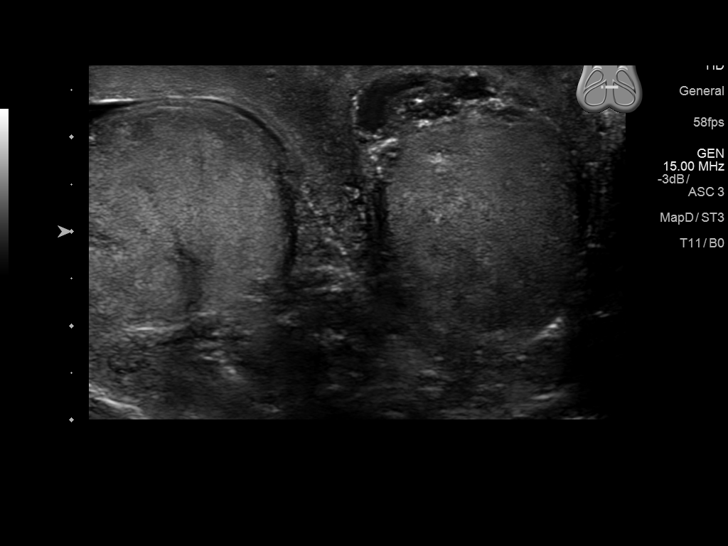
[im 6/61]
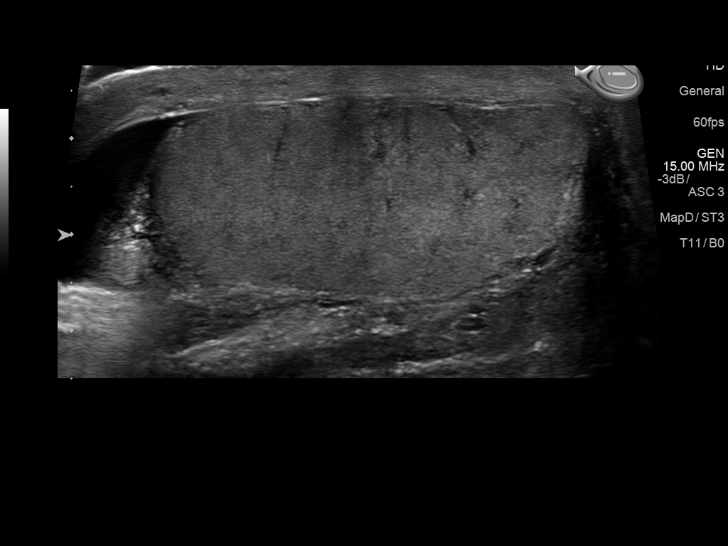
[im 11/61]
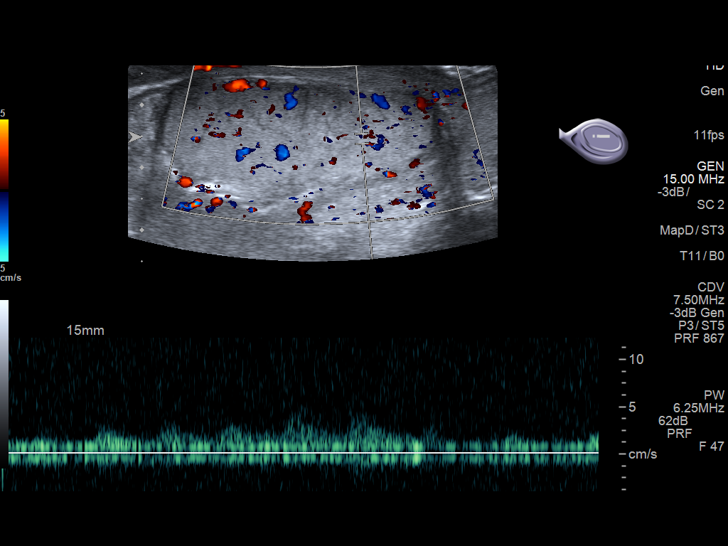
[im 16/61]
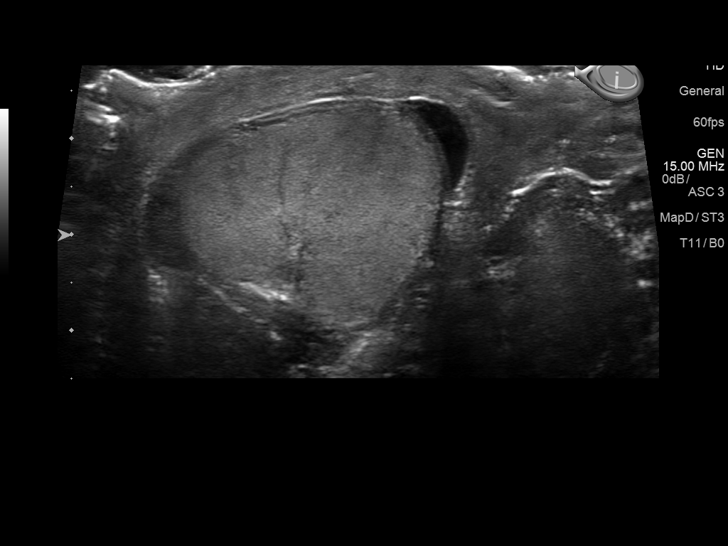
[im 21/61]
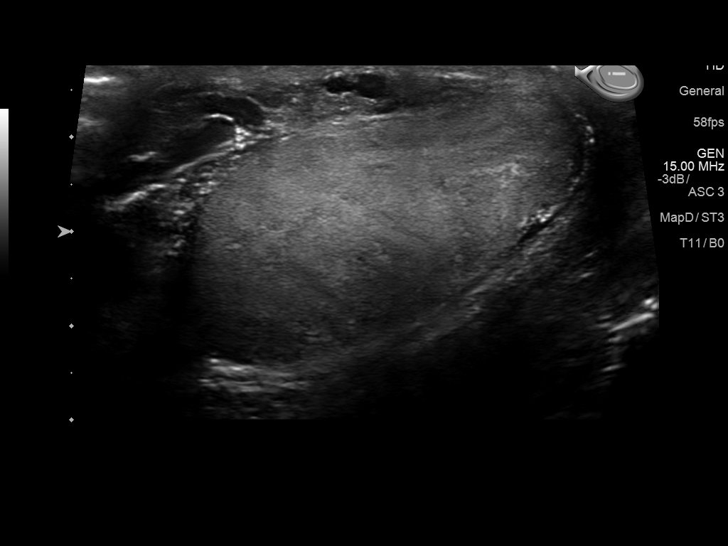
[im 26/61]
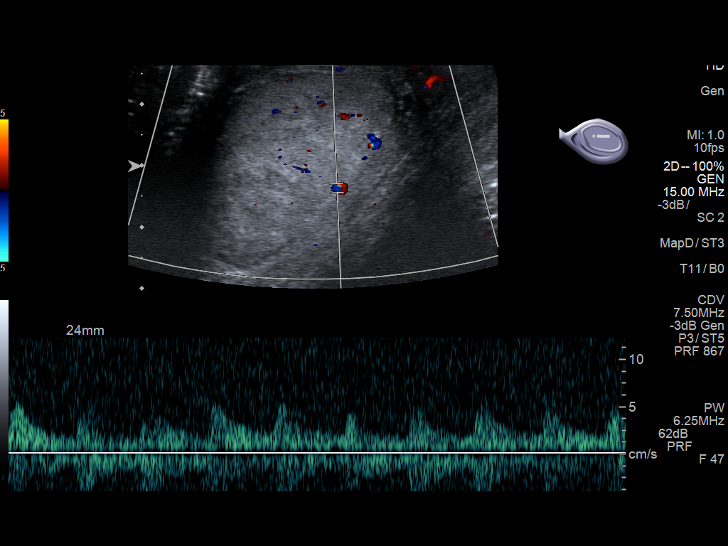
[im 31/61]
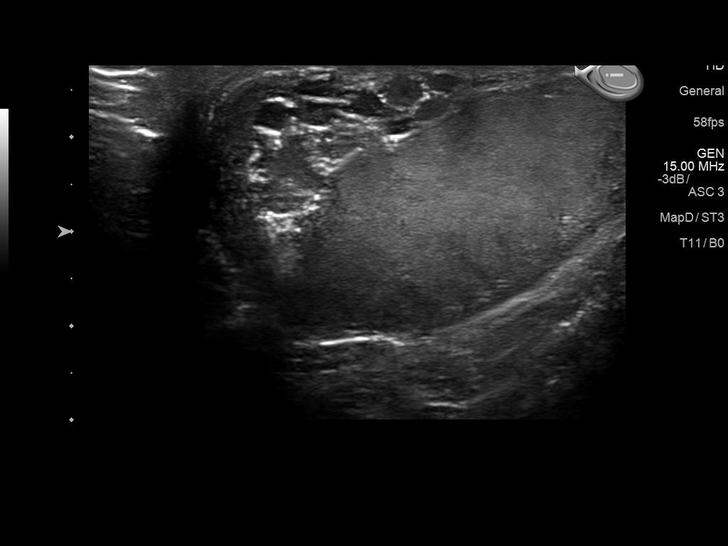
[im 36/61]
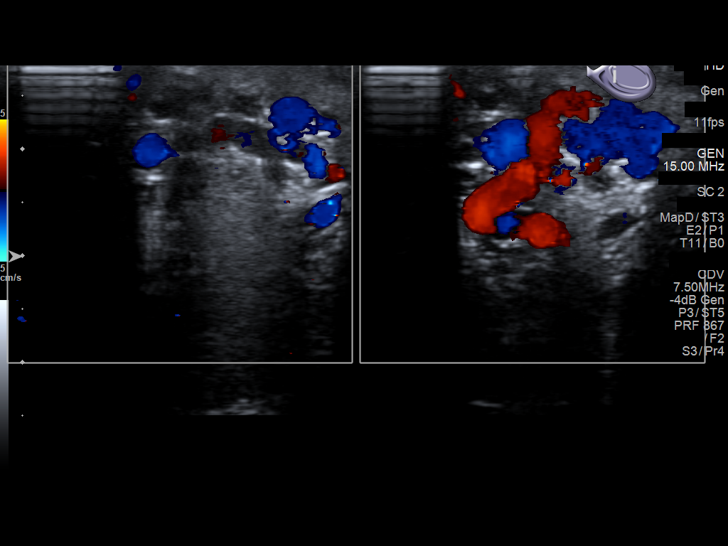
[im 41/61]
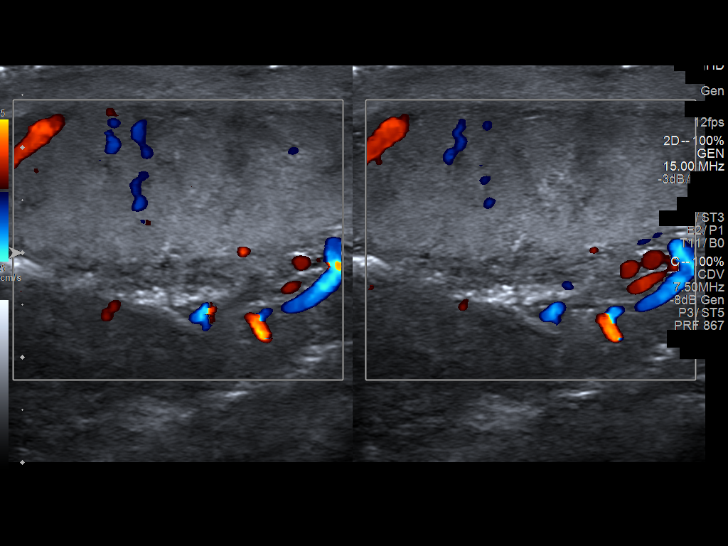
[im 46/61]
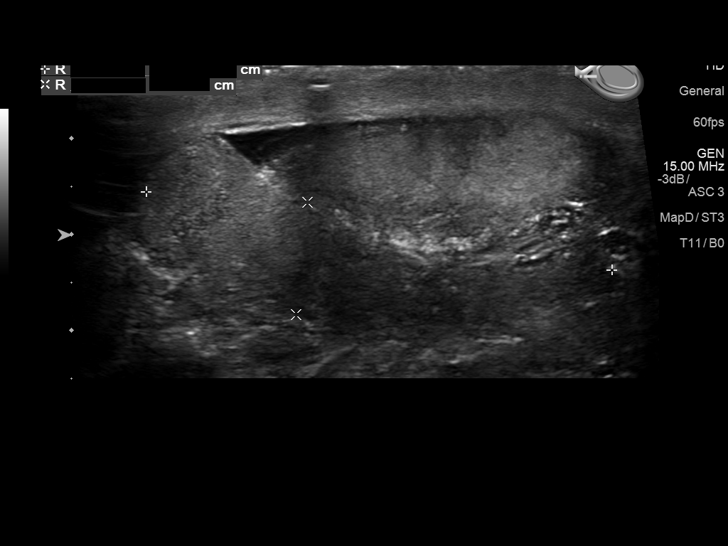
[im 51/61]
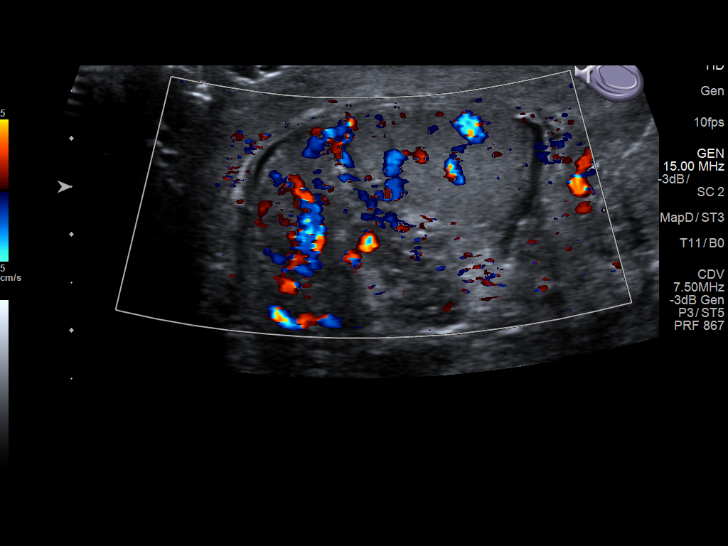
[im 56/61]
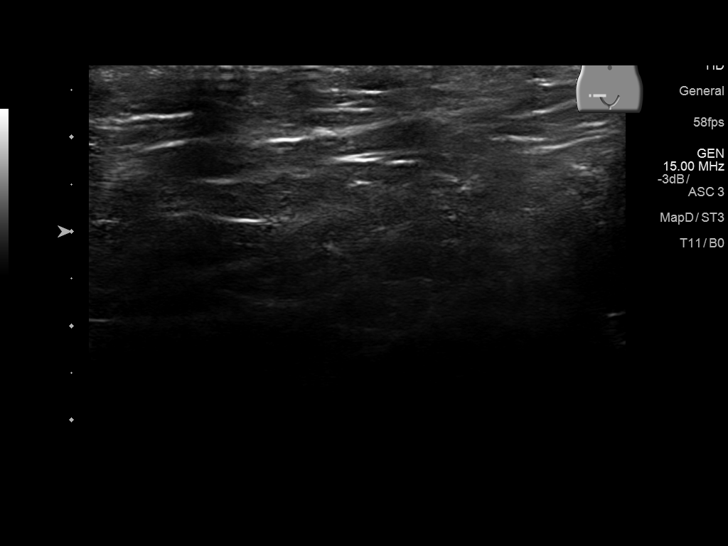
[im 61/61]
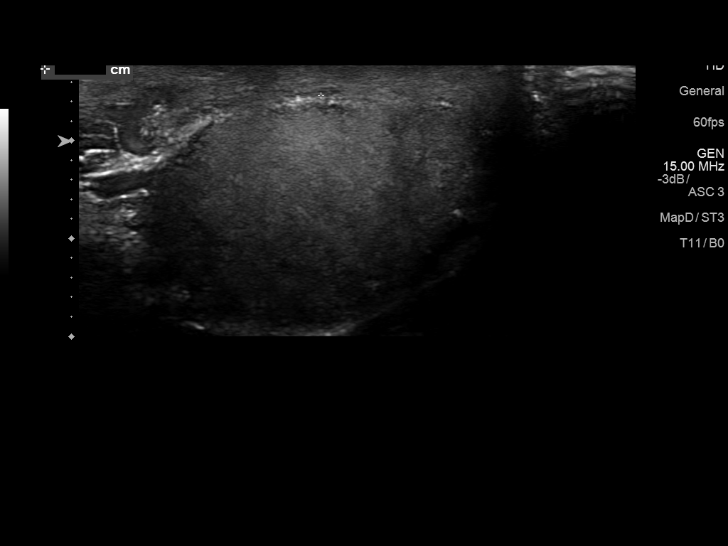

[13 of 25 positions shown; findings below may reference images not displayed]

FINDINGS: Right testicle

Measurements: 4.1 x 2 x 2.6 cm. No mass or microlithiasis
visualized. Mildly increased homogeneous flow on color flow Doppler
imaging. Spectral Doppler evaluation demonstrates normal arterial
and venous waveform patterns.

Left testicle

Measurements: 4.4 x 2.1 x 2.4 cm. No mass or microlithiasis
visualized. Normal homogeneous flow on color flow Doppler imaging.
Spectral Doppler evaluation demonstrates normal arterial and venous
waveform patterns.

Right epididymis: Diffusely enlarged with increased flow on color
flow Doppler imaging.

Left epididymis:  Normal in size and appearance.

Hydrocele:  Small right hydrocele.

Varicocele:  Moderate left varicoceles.

Right scrotal skin thickening is present.
IMPRESSION: Increased flow demonstrated to the right epididymis and testis with
scrotal skin thickening consistent with epididymo-orchitis. Small
right hydrocele. Moderate size left varicocele. No evidence of
testicular mass or torsion.

## 2018-09-03 ENCOUNTER — Encounter: Payer: Self-pay | Admitting: *Deleted

## 2018-09-04 ENCOUNTER — Ambulatory Visit: Admission: RE | Admit: 2018-09-04 | Payer: Medicare Other | Source: Ambulatory Visit | Admitting: Internal Medicine

## 2018-09-04 ENCOUNTER — Encounter: Payer: Self-pay | Admitting: Certified Registered Nurse Anesthetist

## 2018-09-04 ENCOUNTER — Encounter: Admission: RE | Payer: Self-pay | Source: Ambulatory Visit

## 2018-09-04 HISTORY — DX: Aneurysm of heart: I25.3

## 2018-09-04 HISTORY — DX: Gastro-esophageal reflux disease without esophagitis: K21.9

## 2018-09-04 HISTORY — DX: Obesity, unspecified: E66.9

## 2018-09-04 HISTORY — DX: Major depressive disorder, single episode, unspecified: F32.9

## 2018-09-04 HISTORY — DX: Depression, unspecified: F32.A

## 2018-09-04 HISTORY — DX: Vitamin D deficiency, unspecified: E55.9

## 2018-09-04 HISTORY — DX: Anxiety disorder, unspecified: F41.9

## 2018-09-04 HISTORY — DX: Hyperlipidemia, unspecified: E78.5

## 2018-09-04 SURGERY — ESOPHAGOGASTRODUODENOSCOPY (EGD) WITH PROPOFOL
Anesthesia: General

## 2018-11-04 ENCOUNTER — Encounter: Payer: Self-pay | Admitting: *Deleted

## 2018-11-05 ENCOUNTER — Encounter: Admission: RE | Payer: Self-pay | Source: Home / Self Care

## 2018-11-05 ENCOUNTER — Ambulatory Visit: Admission: RE | Admit: 2018-11-05 | Payer: Medicare Other | Source: Home / Self Care | Admitting: Internal Medicine

## 2018-11-05 ENCOUNTER — Encounter: Payer: Self-pay | Admitting: Certified Registered"

## 2018-11-05 HISTORY — DX: Cervicalgia: M54.2

## 2018-11-05 HISTORY — DX: Methicillin resistant Staphylococcus aureus infection, unspecified site: A49.02

## 2018-11-05 SURGERY — ESOPHAGOGASTRODUODENOSCOPY (EGD) WITH PROPOFOL
Anesthesia: General

## 2018-11-21 IMAGING — CT CT RENAL STONE PROTOCOL
2 of 4 series · 16 of 46 positions shown, 18 images · non-contrast
Comparison: December 24, 2012

CLINICAL DATA: Swollen testicle for 3 days. Right-sided flank pain.

EXAM:
CT ABDOMEN AND PELVIS WITHOUT CONTRAST
TECHNIQUE: Multidetector CT imaging of the abdomen and pelvis was performed
following the standard protocol without IV contrast.

[Series 2: axial st · axial · 0.92mm/px · z∈[-1272,-742]mm · 13 of 116 slices shown, 15 images]
[im 5/116  soft-tissue]
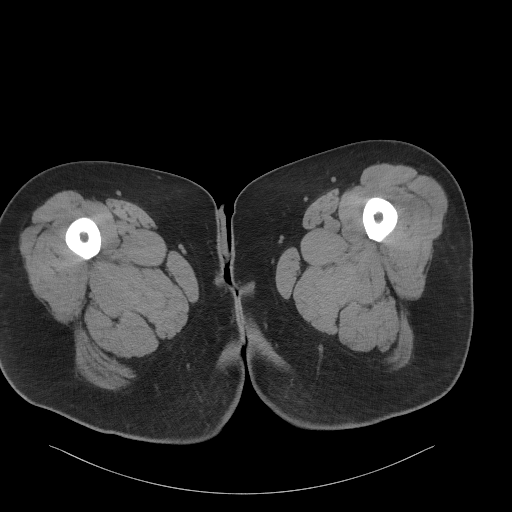
[im 5/116  bone]
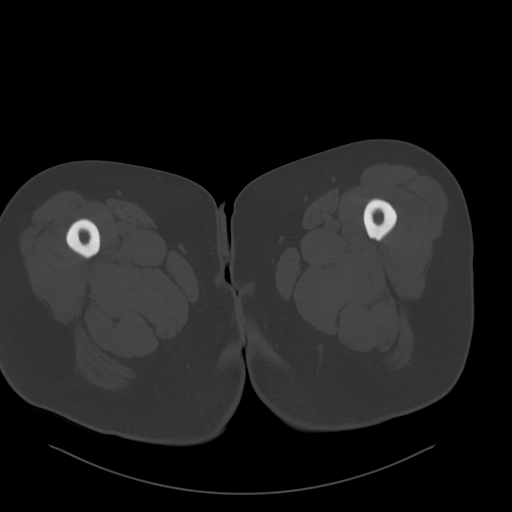
[im 15/116  soft-tissue]
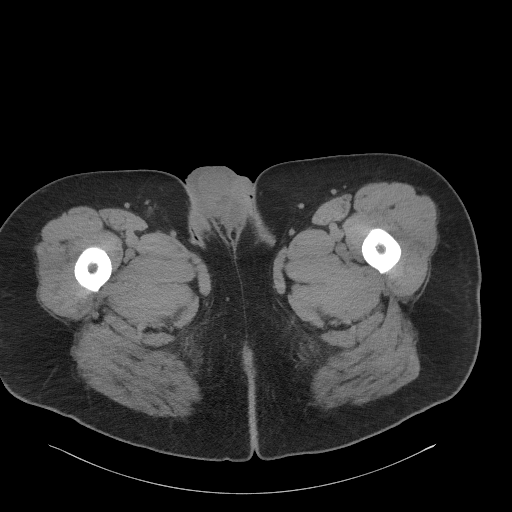
[im 24/116  soft-tissue]
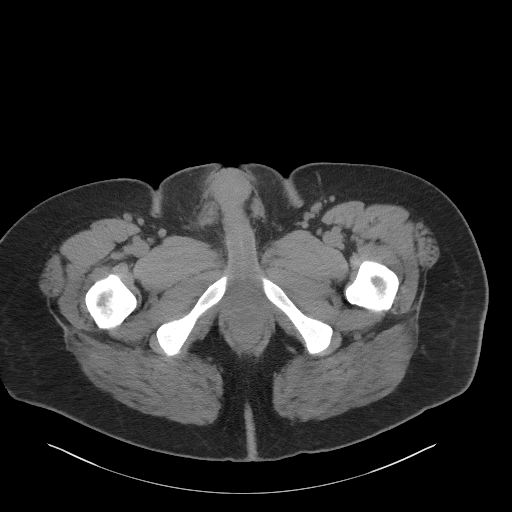
[im 34/116  soft-tissue]
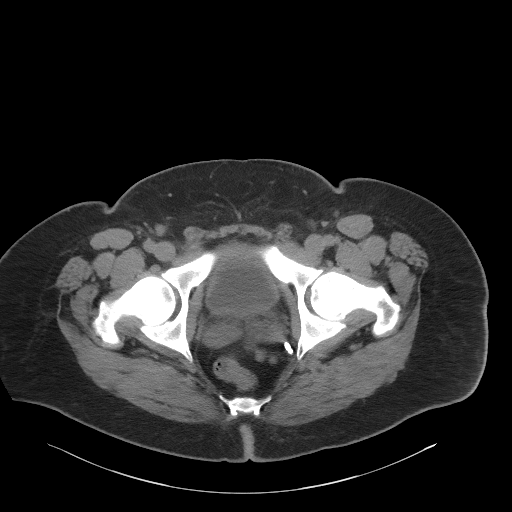
[im 39/116  soft-tissue]
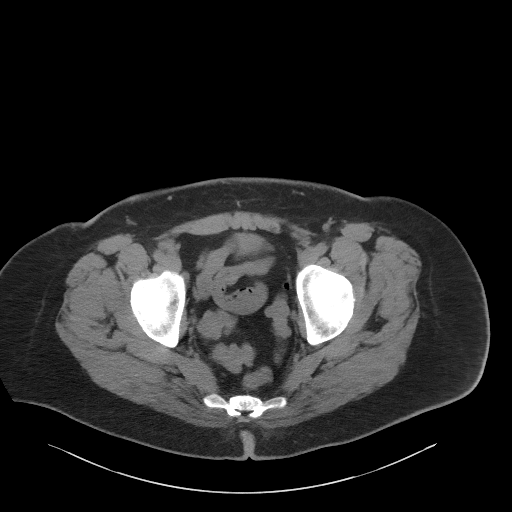
[im 48/116  soft-tissue]
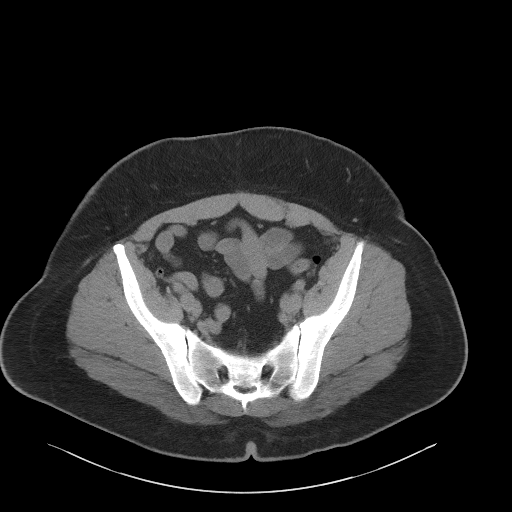
[im 58/116  soft-tissue]
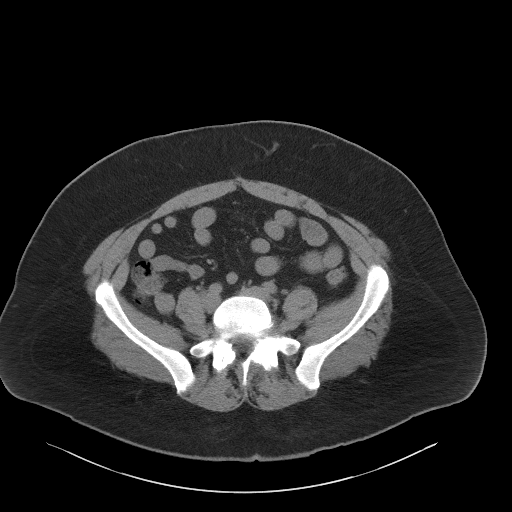
[im 68/116  soft-tissue]
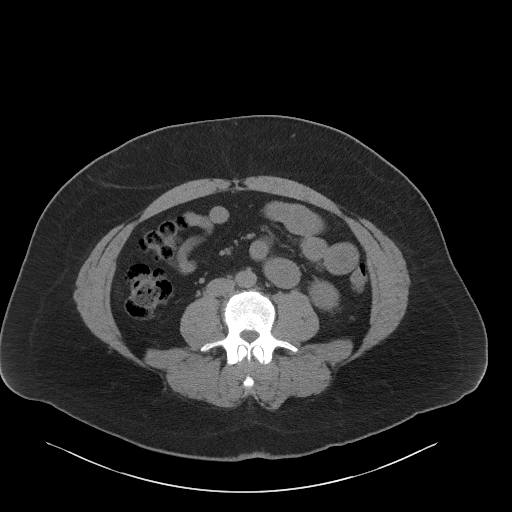
[im 77/116  soft-tissue]
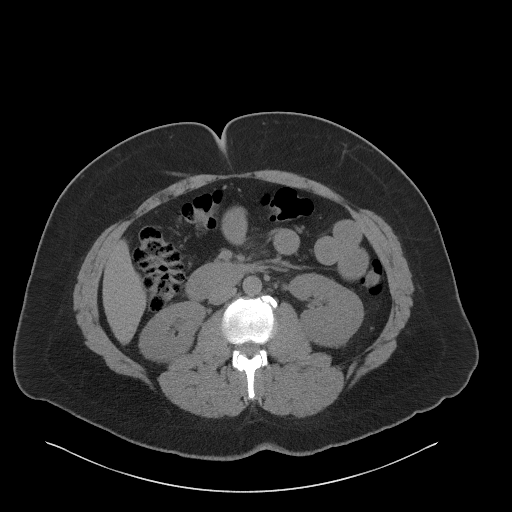
[im 77/116  bone]
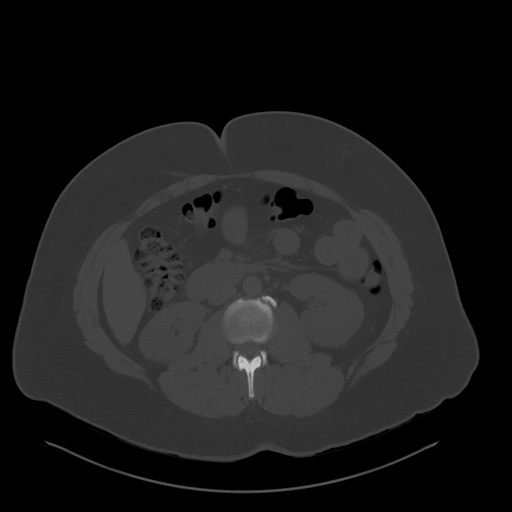
[im 82/116  soft-tissue]
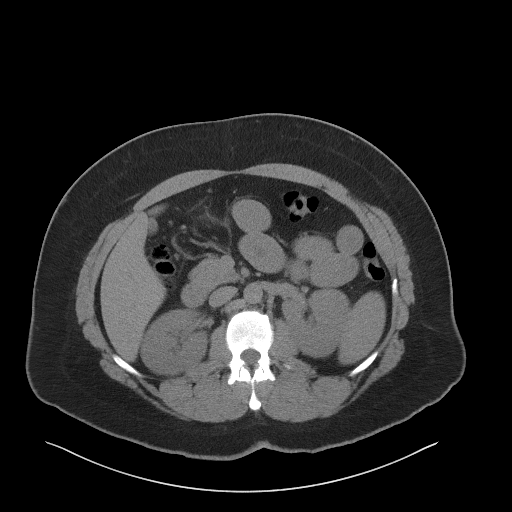
[im 92/116  soft-tissue]
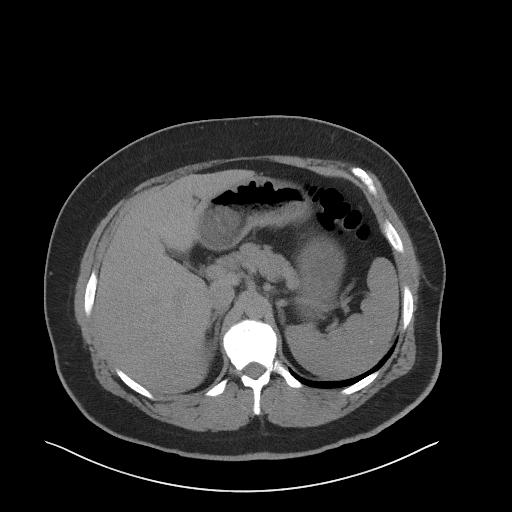
[im 101/116  soft-tissue]
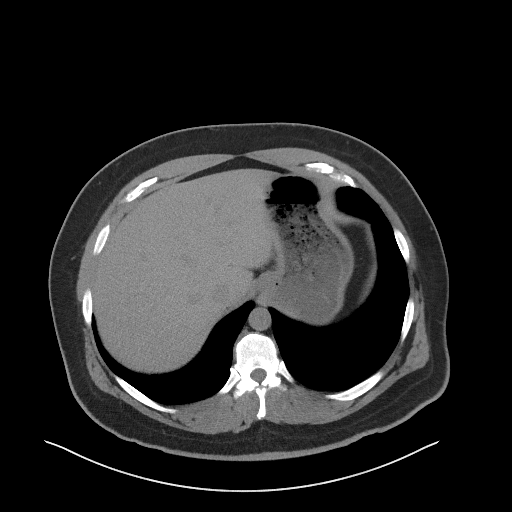
[im 111/116  soft-tissue]
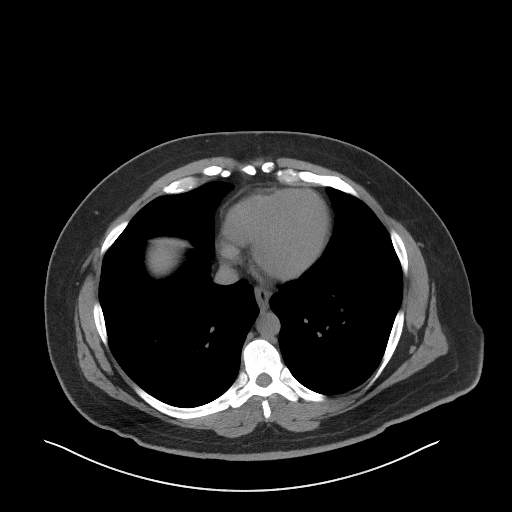

[Series 5: coronal · coronal · 0.97mm/px · 3 of 152 slices shown]
[im 51/152  soft-tissue]
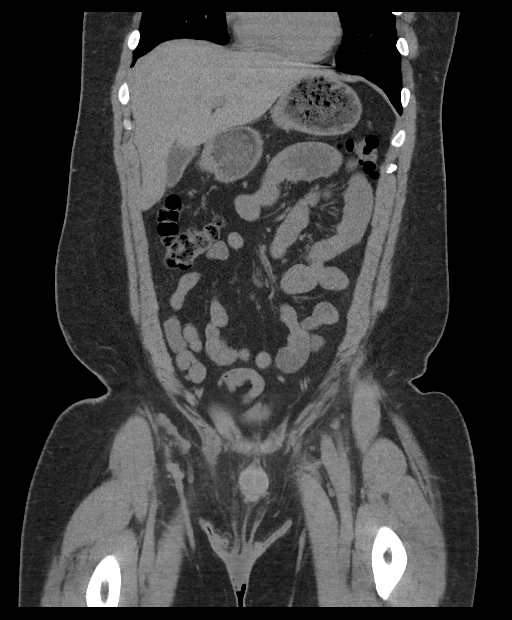
[im 68/152  soft-tissue]
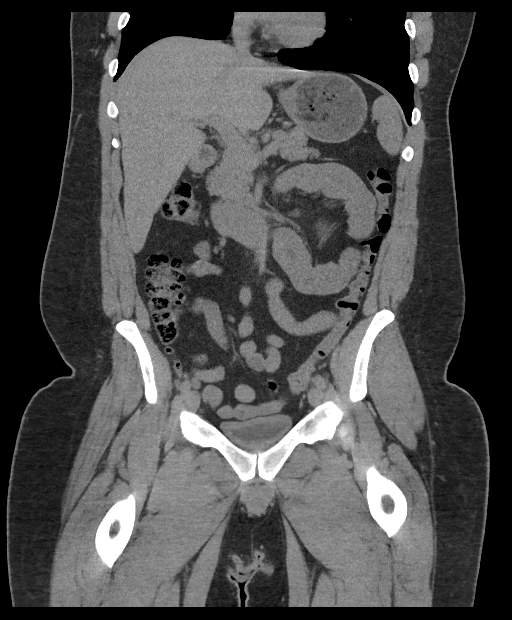
[im 84/152  soft-tissue]
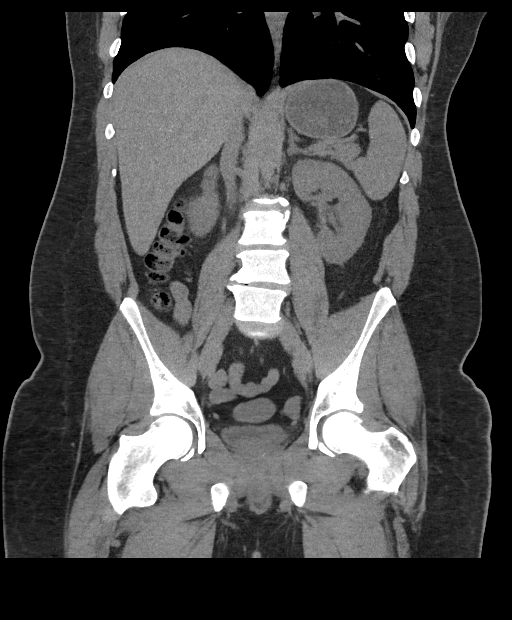

[16 of 46 positions shown; findings below may reference images not displayed]

FINDINGS: Lower chest: No acute abnormality.

Hepatobiliary: No focal liver abnormality is seen. No gallstones,
gallbladder wall thickening, or biliary dilatation.

Pancreas: Unremarkable. No pancreatic ductal dilatation or
surrounding inflammatory changes.

Spleen: Tiny calcifications in the spleen are consistent previous
granulomatous disease.

Adrenals/Urinary Tract: No right-sided renal stones identified. A
tiny 2 mm stone is seen in the upper pole of the left kidney.
Probable renal cysts. No hydronephrosis or perinephric stranding. No
ureterectasis or ureteral stones. The bladder is poorly distended
but the wall is prominent. No fat stranding adjacent to the bladder.

Stomach/Bowel: The stomach and small bowel are normal. The colon and
appendix are normal.

Vascular/Lymphatic: No significant vascular findings are present. No
enlarged abdominal or pelvic lymph nodes.

Reproductive: There is a right-sided hydrocele. The prostate and
seminal vesicles are normal.

Other: No abdominal wall hernia or abnormality. No abdominopelvic
ascites.

Musculoskeletal: No acute or significant osseous findings.
IMPRESSION: 1. Right-sided hydrocele around the right testicle. Given symptoms,
consider testicular ultrasound.
2. Nonobstructive stone in the left kidney.  No renal obstruction.
3. Mild prominence of the bladder wall. Whether this is a true
finding or secondary to poor distention is unclear. Recommend
correlation with urinalysis.

## 2019-04-22 ENCOUNTER — Emergency Department: Payer: Medicare Other

## 2019-04-22 ENCOUNTER — Encounter: Payer: Self-pay | Admitting: Emergency Medicine

## 2019-04-22 ENCOUNTER — Emergency Department
Admission: EM | Admit: 2019-04-22 | Discharge: 2019-04-23 | Disposition: A | Discharge status: Home / Self Care | Payer: Medicare Other | Attending: Emergency Medicine | Admitting: Emergency Medicine

## 2019-04-22 ENCOUNTER — Other Ambulatory Visit: Payer: Self-pay

## 2019-04-22 DIAGNOSIS — L089 Local infection of the skin and subcutaneous tissue, unspecified: Secondary | ICD-10-CM | POA: Insufficient documentation

## 2019-04-22 DIAGNOSIS — J449 Chronic obstructive pulmonary disease, unspecified: Secondary | ICD-10-CM | POA: Diagnosis not present

## 2019-04-22 DIAGNOSIS — Z79899 Other long term (current) drug therapy: Secondary | ICD-10-CM | POA: Insufficient documentation

## 2019-04-22 DIAGNOSIS — J3489 Other specified disorders of nose and nasal sinuses: Secondary | ICD-10-CM

## 2019-04-22 DIAGNOSIS — J45909 Unspecified asthma, uncomplicated: Secondary | ICD-10-CM | POA: Insufficient documentation

## 2019-04-22 DIAGNOSIS — Z87891 Personal history of nicotine dependence: Secondary | ICD-10-CM | POA: Diagnosis not present

## 2019-04-22 DIAGNOSIS — L539 Erythematous condition, unspecified: Secondary | ICD-10-CM | POA: Diagnosis present

## 2019-04-22 LAB — COMPREHENSIVE METABOLIC PANEL
ALT: 15 U/L (ref 0–44)
AST: 17 U/L (ref 15–41)
Albumin: 4.4 g/dL (ref 3.5–5.0)
Alkaline Phosphatase: 71 U/L (ref 38–126)
Anion gap: 7 (ref 5–15)
BUN: 16 mg/dL (ref 6–20)
CO2: 26 mmol/L (ref 22–32)
Calcium: 9.6 mg/dL (ref 8.9–10.3)
Chloride: 105 mmol/L (ref 98–111)
Creatinine, Ser: 0.78 mg/dL (ref 0.61–1.24)
GFR calc Af Amer: >60 mL/min (ref >60)
GFR calc non Af Amer: >60 mL/min (ref >60)
Glucose, Bld: 117 mg/dL — ABNORMAL HIGH (ref 70–99)
Potassium: 4.4 mmol/L (ref 3.5–5.1)
Sodium: 138 mmol/L (ref 135–145)
Total Bilirubin: 0.6 mg/dL (ref 0.3–1.2)
Total Protein: 7.2 g/dL (ref 6.5–8.1)

## 2019-04-22 LAB — CBC WITH DIFFERENTIAL/PLATELET
Abs Immature Granulocytes: 0.03 10*3/uL (ref 0.00–0.07)
Basophils Absolute: 0.1 10*3/uL (ref 0.0–0.1)
Basophils Relative: 1 %
Eosinophils Absolute: 0.7 10*3/uL — ABNORMAL HIGH (ref 0.0–0.5)
Eosinophils Relative: 8 %
HCT: 51.6 % (ref 39.0–52.0)
Hemoglobin: 16.6 g/dL (ref 13.0–17.0)
Immature Granulocytes: 0 %
Lymphocytes Relative: 23 %
Lymphs Abs: 1.9 10*3/uL (ref 0.7–4.0)
MCH: 27.5 pg (ref 26.0–34.0)
MCHC: 32.2 g/dL (ref 30.0–36.0)
MCV: 85.6 fL (ref 80.0–100.0)
Monocytes Absolute: 0.5 10*3/uL (ref 0.1–1.0)
Monocytes Relative: 6 %
Neutro Abs: 5 10*3/uL (ref 1.7–7.7)
Neutrophils Relative %: 62 %
Platelets: 169 10*3/uL (ref 150–400)
RBC: 6.03 MIL/uL — ABNORMAL HIGH (ref 4.22–5.81)
RDW: 13.7 % (ref 11.5–15.5)
WBC: 8.1 10*3/uL (ref 4.0–10.5)
nRBC: 0 % (ref 0.0–0.2)

## 2019-04-22 LAB — LACTIC ACID, PLASMA: Lactic Acid, Venous: 1.5 mmol/L (ref 0.5–1.9)

## 2019-04-22 MED ORDER — OXYCODONE-ACETAMINOPHEN 5-325 MG PO TABS: 1.0000 | ORAL_TABLET | ORAL | 0 refills | Status: AC | PRN

## 2019-04-22 MED ORDER — VANCOMYCIN HCL IN DEXTROSE 1-5 GM/200ML-% IV SOLN: 1000.0000 mg | Freq: Once | INTRAVENOUS | Status: DC

## 2019-04-22 MED ORDER — PIPERACILLIN-TAZOBACTAM 3.375 G IVPB 30 MIN
3.3750 g | Freq: Once | INTRAVENOUS | Status: AC
  Administered 2019-04-22: 3.375 g via INTRAVENOUS
  Filled 2019-04-22: qty 50

## 2019-04-22 MED ORDER — SULFAMETHOXAZOLE-TRIMETHOPRIM 800-160 MG PO TABS: 1.0000 | ORAL_TABLET | Freq: Once | ORAL | Status: DC

## 2019-04-22 MED ORDER — OXYCODONE-ACETAMINOPHEN 5-325 MG PO TABS
1.0000 | ORAL_TABLET | Freq: Once | ORAL | Status: AC
  Administered 2019-04-22: 1 via ORAL
  Filled 2019-04-22: qty 1

## 2019-04-22 MED ORDER — SALINE SPRAY 0.65 % NA SOLN
1.0000 | Freq: Once | NASAL | Status: DC
  Filled 2019-04-22: qty 44

## 2019-04-22 MED ORDER — CEPHALEXIN 500 MG PO CAPS: 500.0000 mg | ORAL_CAPSULE | Freq: Four times a day (QID) | ORAL | Status: DC

## 2019-04-22 MED ORDER — IOHEXOL 300 MG/ML  SOLN
75.0000 mL | Freq: Once | INTRAMUSCULAR | Status: AC | PRN
  Administered 2019-04-22: 75 mL via INTRAVENOUS

## 2019-04-22 MED ORDER — SULFAMETHOXAZOLE-TRIMETHOPRIM 800-160 MG PO TABS: 1.0000 | ORAL_TABLET | Freq: Two times a day (BID) | ORAL | 0 refills | Status: DC

## 2019-04-22 MED ORDER — CEPHALEXIN 500 MG PO CAPS: 500.0000 mg | ORAL_CAPSULE | Freq: Four times a day (QID) | ORAL | 0 refills | Status: AC

## 2019-04-22 MED ORDER — VANCOMYCIN HCL IN DEXTROSE 1-5 GM/200ML-% IV SOLN
1000.0000 mg | Freq: Once | INTRAVENOUS | Status: AC
  Administered 2019-04-22: 22:00:00 1000 mg via INTRAVENOUS
  Filled 2019-04-22: qty 200
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
## 2019-04-22 NOTE — ED Triage Notes (Signed)
Patient presents to the ED with a sore to his nose.  Patient states he tripped in the woods and hit his nose approx. 1 month ago.  Patient states, "since I've been wearing a mask, it got infected. Patient's doctor prescribed antibiotic cream for area.  Patient states, "I can smell rotten stuff from it."  Are to nose is smaller than a dime, appears blackened with slight redness surrounding it.  Patient has no signs of systemic infection.

## 2019-04-22 NOTE — ED Notes (Signed)
Pt has pain, redness, and a black area to left tip of nose and nares.  Sx for 1 month.  Pt tripped and fell in the woods 1 month ago and struck face on wood and dirt.  Pt  began wearing a mask because of covid and reports sx are worse.  Pt alert  Speech clear.

## 2019-04-22 NOTE — Discharge Instructions (Addendum)
Call [facility] in the morning.  Tell them that you were seen in the ER by Dr. Cinda Quest.  Tell them that I spoke with Dr. Ladene Artist, and he said his partners will be there they can see you in the morning.  Take the Keflex 1 pill 4 times a day.  Return here for any worsening of the redness or black area.  Use the salt water nasal spray 5 or 6 times a day.  Keep the area covered with a Band-Aid which you spray the salt water nasal spray on every day.  Use a second bottle of the nasal spray not the one you put in your nose to wet the Band-Aid.  Take the Percocet 1 pill 4 times a day as needed for pain.  Please return here for any worsening

## 2019-04-22 NOTE — ED Notes (Signed)
Pt return from ct scan 

## 2019-04-22 NOTE — ED Notes (Signed)
Report off to [employer]

## 2019-04-23 NOTE — ED Notes (Signed)
Pt states he did not want to wait for saline spray to come from the pharmacy as his ride was here and would get some from the pharmacy tomorrow if he did not have any at home. Discussed follow up instructions and return precautions. Pt verbalized understanding.

## 2020-04-30 ENCOUNTER — Other Ambulatory Visit: Payer: Self-pay | Admitting: Gastroenterology

## 2020-04-30 DIAGNOSIS — R1013 Epigastric pain: Secondary | ICD-10-CM

## 2020-04-30 DIAGNOSIS — K824 Cholesterolosis of gallbladder: Secondary | ICD-10-CM

## 2020-05-04 ENCOUNTER — Ambulatory Visit: Admission: RE | Admit: 2020-05-04 | Payer: Medicare Other | Source: Home / Self Care

## 2020-05-04 ENCOUNTER — Encounter: Admission: RE | Payer: Self-pay | Source: Home / Self Care

## 2020-05-04 SURGERY — COLONOSCOPY WITH PROPOFOL
Anesthesia: General

## 2021-02-10 DIAGNOSIS — G4700 Insomnia, unspecified: Secondary | ICD-10-CM | POA: Diagnosis not present

## 2021-02-10 DIAGNOSIS — Z79891 Long term (current) use of opiate analgesic: Secondary | ICD-10-CM | POA: Diagnosis not present

## 2021-02-10 DIAGNOSIS — G894 Chronic pain syndrome: Secondary | ICD-10-CM | POA: Diagnosis not present

## 2021-02-10 DIAGNOSIS — Z79899 Other long term (current) drug therapy: Secondary | ICD-10-CM | POA: Diagnosis not present

## 2021-02-10 DIAGNOSIS — M542 Cervicalgia: Secondary | ICD-10-CM | POA: Diagnosis not present

## 2021-02-11 DIAGNOSIS — Z0000 Encounter for general adult medical examination without abnormal findings: Secondary | ICD-10-CM | POA: Diagnosis not present

## 2021-02-11 DIAGNOSIS — J449 Chronic obstructive pulmonary disease, unspecified: Secondary | ICD-10-CM | POA: Diagnosis not present

## 2021-02-11 DIAGNOSIS — R69 Illness, unspecified: Secondary | ICD-10-CM | POA: Diagnosis not present

## 2021-02-11 DIAGNOSIS — K219 Gastro-esophageal reflux disease without esophagitis: Secondary | ICD-10-CM | POA: Diagnosis not present

## 2021-02-22 DIAGNOSIS — F900 Attention-deficit hyperactivity disorder, predominantly inattentive type: Secondary | ICD-10-CM | POA: Diagnosis not present

## 2021-02-22 DIAGNOSIS — R69 Illness, unspecified: Secondary | ICD-10-CM | POA: Diagnosis not present

## 2021-03-17 DIAGNOSIS — H9392 Unspecified disorder of left ear: Secondary | ICD-10-CM | POA: Diagnosis not present

## 2021-03-17 DIAGNOSIS — Z6841 Body Mass Index (BMI) 40.0 and over, adult: Secondary | ICD-10-CM | POA: Diagnosis not present

## 2021-03-21 DIAGNOSIS — Z79890 Hormone replacement therapy (postmenopausal): Secondary | ICD-10-CM | POA: Diagnosis not present

## 2021-03-21 DIAGNOSIS — Z87891 Personal history of nicotine dependence: Secondary | ICD-10-CM | POA: Diagnosis not present

## 2021-03-21 DIAGNOSIS — K219 Gastro-esophageal reflux disease without esophagitis: Secondary | ICD-10-CM | POA: Diagnosis not present

## 2021-03-21 DIAGNOSIS — Z79899 Other long term (current) drug therapy: Secondary | ICD-10-CM | POA: Diagnosis not present

## 2021-03-21 DIAGNOSIS — Z7951 Long term (current) use of inhaled steroids: Secondary | ICD-10-CM | POA: Diagnosis not present

## 2021-03-21 DIAGNOSIS — H61002 Unspecified perichondritis of left external ear: Secondary | ICD-10-CM | POA: Diagnosis not present

## 2021-03-21 DIAGNOSIS — J449 Chronic obstructive pulmonary disease, unspecified: Secondary | ICD-10-CM | POA: Diagnosis not present

## 2021-03-21 DIAGNOSIS — R69 Illness, unspecified: Secondary | ICD-10-CM | POA: Diagnosis not present

## 2021-03-21 DIAGNOSIS — Z8614 Personal history of Methicillin resistant Staphylococcus aureus infection: Secondary | ICD-10-CM | POA: Diagnosis not present

## 2021-03-21 DIAGNOSIS — H9202 Otalgia, left ear: Secondary | ICD-10-CM | POA: Diagnosis not present

## 2021-03-21 DIAGNOSIS — L539 Erythematous condition, unspecified: Secondary | ICD-10-CM | POA: Diagnosis not present

## 2021-03-21 DIAGNOSIS — H61032 Chondritis of left external ear: Secondary | ICD-10-CM | POA: Diagnosis not present

## 2021-03-30 DIAGNOSIS — H9392 Unspecified disorder of left ear: Secondary | ICD-10-CM | POA: Diagnosis not present

## 2021-03-30 DIAGNOSIS — Z6841 Body Mass Index (BMI) 40.0 and over, adult: Secondary | ICD-10-CM | POA: Diagnosis not present

## 2021-05-05 DIAGNOSIS — M542 Cervicalgia: Secondary | ICD-10-CM | POA: Diagnosis not present

## 2021-05-05 DIAGNOSIS — Z79891 Long term (current) use of opiate analgesic: Secondary | ICD-10-CM | POA: Diagnosis not present

## 2021-05-05 DIAGNOSIS — G894 Chronic pain syndrome: Secondary | ICD-10-CM | POA: Diagnosis not present

## 2021-05-05 DIAGNOSIS — Z79899 Other long term (current) drug therapy: Secondary | ICD-10-CM | POA: Diagnosis not present

## 2021-05-05 DIAGNOSIS — G4700 Insomnia, unspecified: Secondary | ICD-10-CM | POA: Diagnosis not present

## 2021-05-12 DIAGNOSIS — F900 Attention-deficit hyperactivity disorder, predominantly inattentive type: Secondary | ICD-10-CM | POA: Diagnosis not present

## 2021-05-12 DIAGNOSIS — E039 Hypothyroidism, unspecified: Secondary | ICD-10-CM | POA: Diagnosis not present

## 2021-05-12 DIAGNOSIS — F4310 Post-traumatic stress disorder, unspecified: Secondary | ICD-10-CM | POA: Diagnosis not present

## 2021-05-12 DIAGNOSIS — R69 Illness, unspecified: Secondary | ICD-10-CM | POA: Diagnosis not present

## 2021-08-25 ENCOUNTER — Other Ambulatory Visit (HOSPITAL_BASED_OUTPATIENT_CLINIC_OR_DEPARTMENT_OTHER): Payer: Self-pay

## 2021-08-25 ENCOUNTER — Other Ambulatory Visit: Payer: Self-pay

## 2021-08-25 DIAGNOSIS — M5416 Radiculopathy, lumbar region: Secondary | ICD-10-CM
